# Patient Record
Sex: Male | Born: 1980 | Race: White | Hispanic: No | Marital: Married | Smoking: Former smoker
Health system: Southern US, Community
[De-identification: ages and names within clinical notes are randomized; demographics above are authoritative.]

## PROBLEM LIST (undated history)

## (undated) DIAGNOSIS — E785 Hyperlipidemia, unspecified: Secondary | ICD-10-CM

## (undated) DIAGNOSIS — K429 Umbilical hernia without obstruction or gangrene: Secondary | ICD-10-CM

## (undated) DIAGNOSIS — E559 Vitamin D deficiency, unspecified: Secondary | ICD-10-CM

## (undated) DIAGNOSIS — J189 Pneumonia, unspecified organism: Secondary | ICD-10-CM

## (undated) HISTORY — DX: Vitamin D deficiency, unspecified: E55.9

## (undated) HISTORY — DX: Hyperlipidemia, unspecified: E78.5

## (undated) HISTORY — PX: OTHER SURGICAL HISTORY: SHX169

---

## 2009-06-07 ENCOUNTER — Emergency Department (HOSPITAL_COMMUNITY): Admission: EM | Admit: 2009-06-07 | Discharge: 2009-06-07 | Payer: Self-pay | Admitting: Emergency Medicine

## 2009-06-08 ENCOUNTER — Other Ambulatory Visit: Payer: Self-pay | Admitting: Emergency Medicine

## 2009-06-09 ENCOUNTER — Inpatient Hospital Stay (HOSPITAL_COMMUNITY): Admission: EM | Admit: 2009-06-09 | Discharge: 2009-06-09 | Payer: Self-pay | Admitting: Internal Medicine

## 2010-07-22 IMAGING — CR DG HAND COMPLETE 3+V*R*
3 series · 3 of 3 positions shown · non-contrast
Comparison: None

CLINICAL DATA: History given of edema.  History of snake bite.

RIGHT HAND - COMPLETE 3+ VIEW

[view not recorded (1 of 3)]
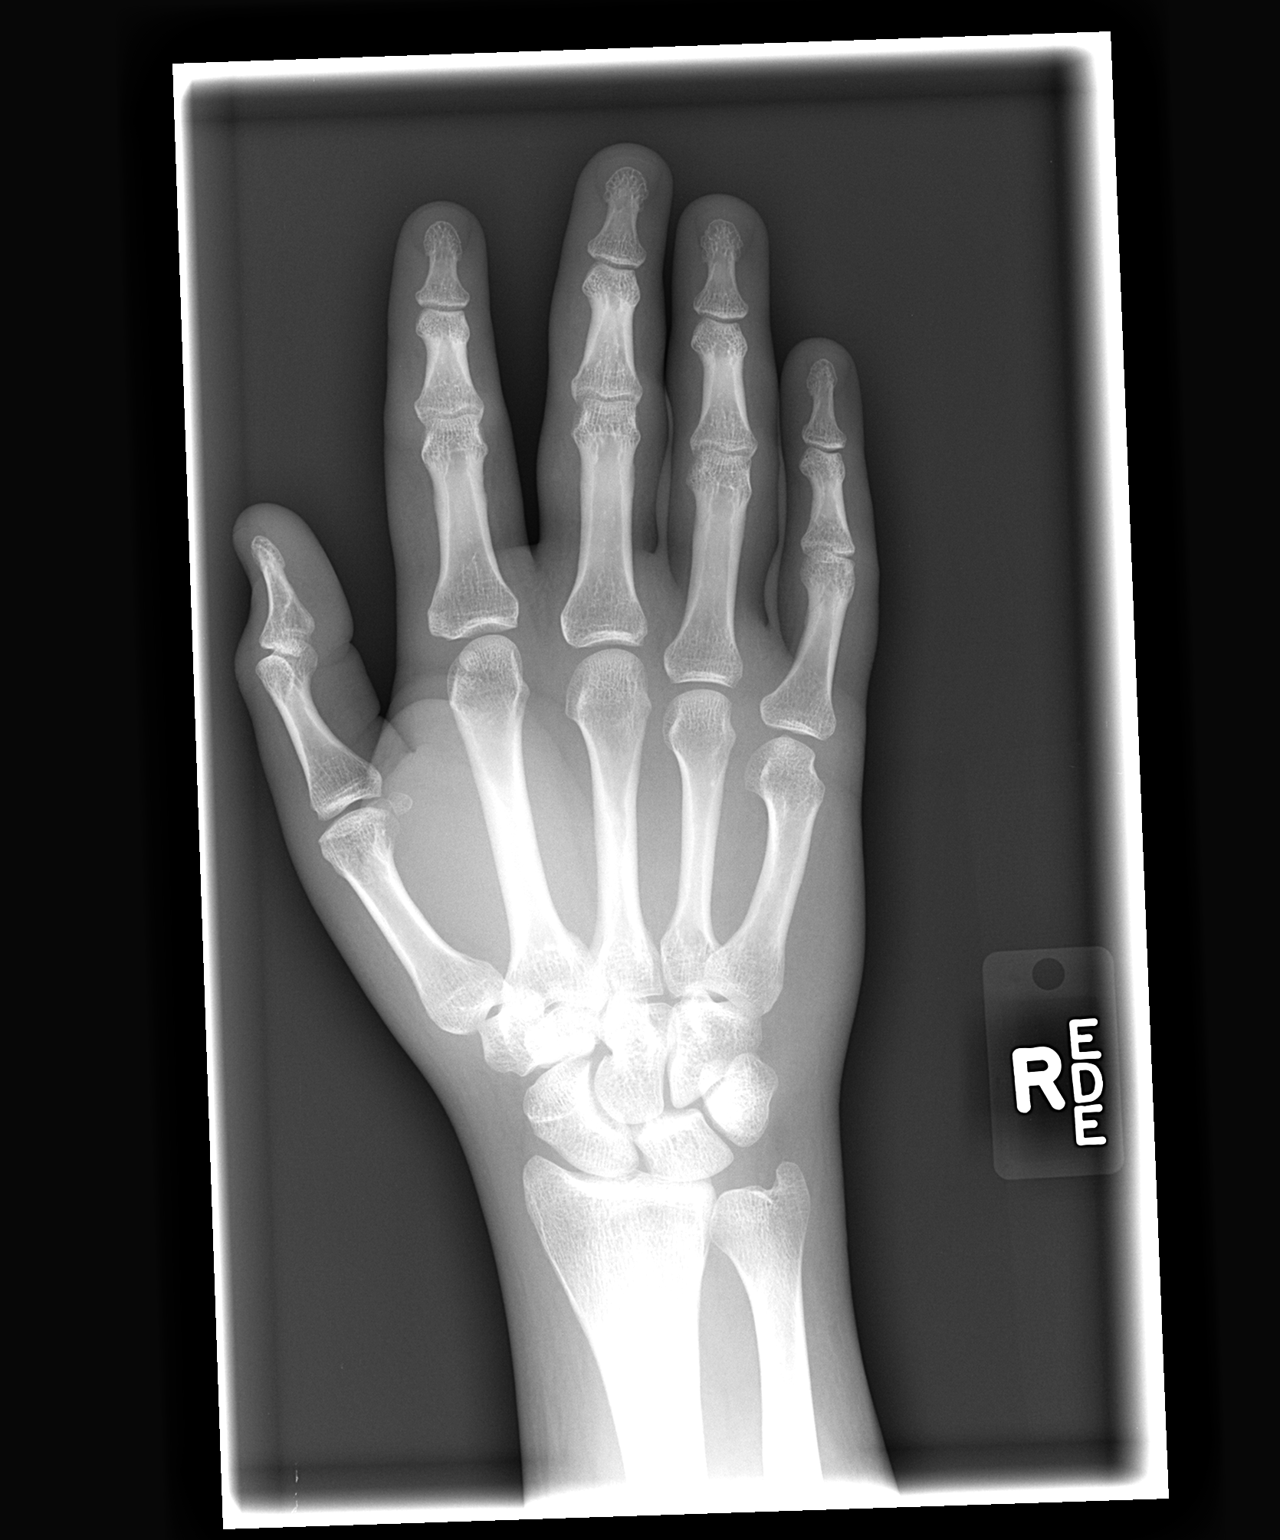

[view not recorded (2 of 3)]
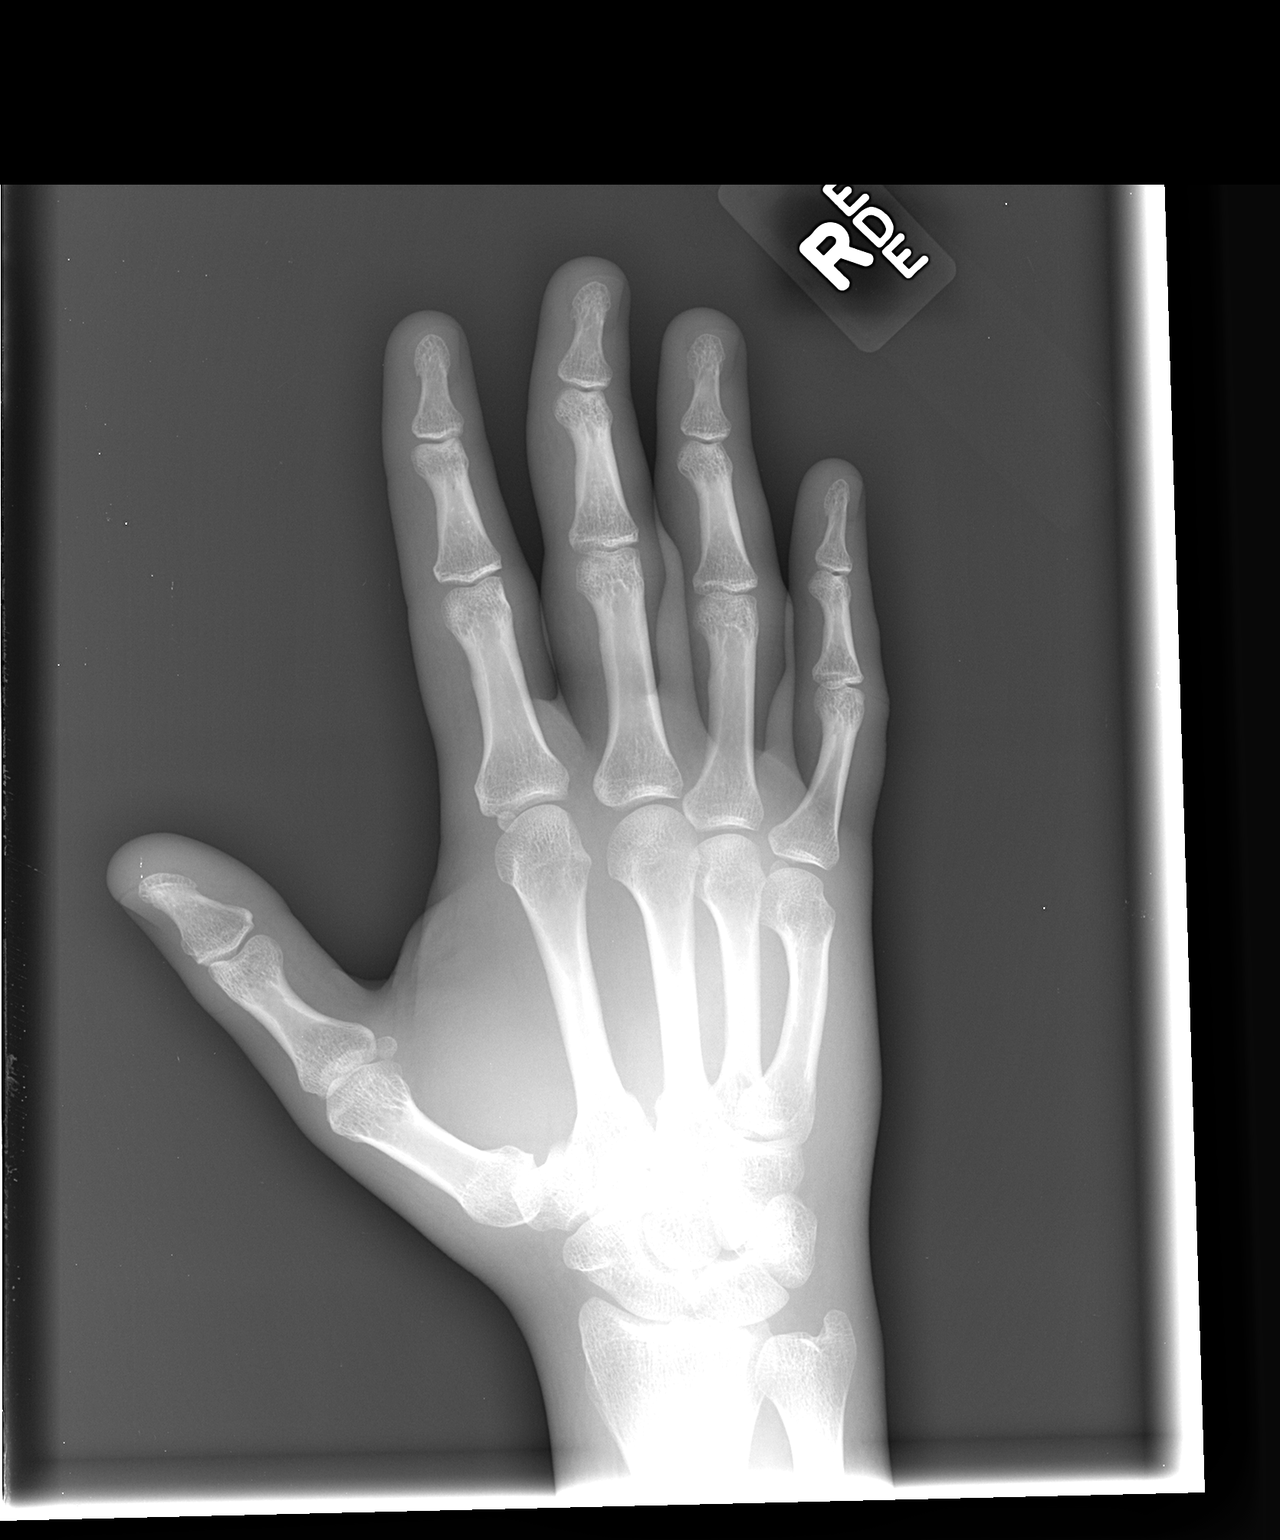

[view not recorded (3 of 3)]
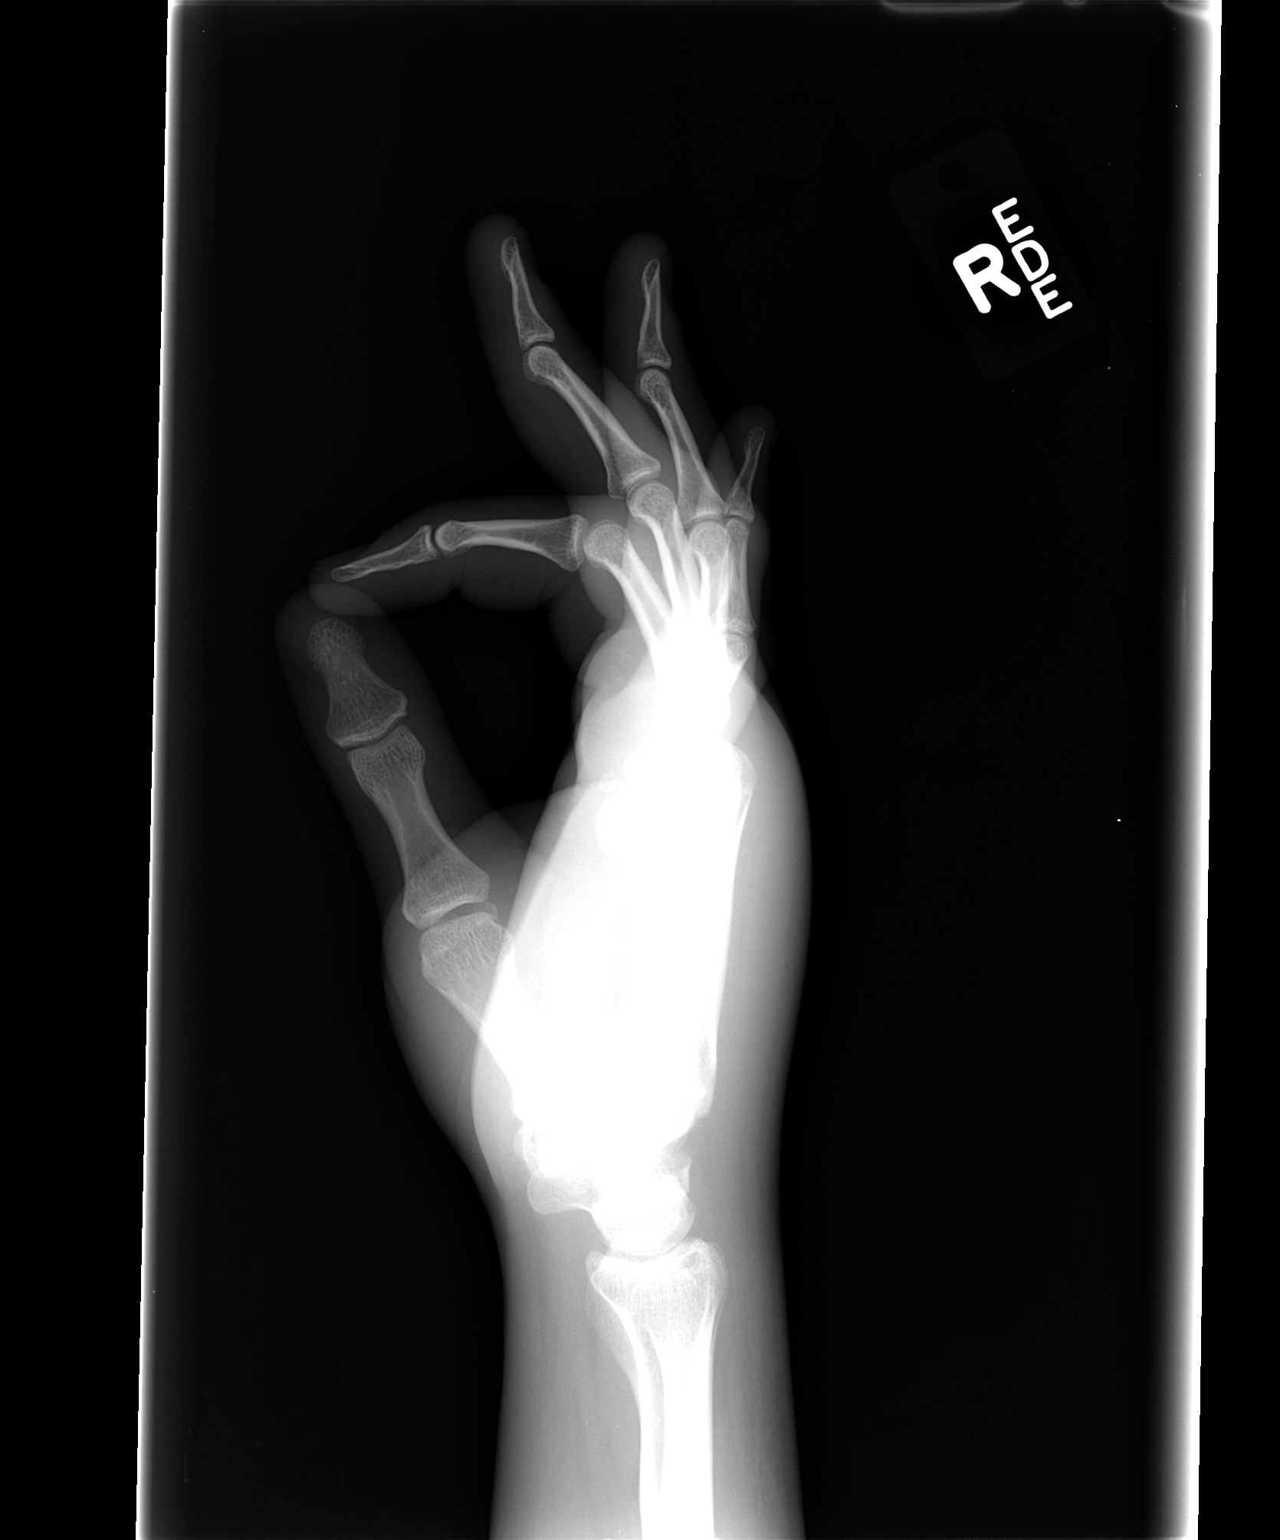

[3 of 3 positions shown; findings below may reference images not displayed]

FINDINGS: There appears to be some soft tissue swelling.  No
fracture or dislocation is evident.  No opaque foreign body is
seen.
IMPRESSION: Soft tissue swelling is present.  No bony lesion is evident.

## 2010-07-22 IMAGING — CR DG WRIST COMPLETE 3+V*R*
2 series · 2 of 2 positions shown · non-contrast
Comparison: 06/09/2009

CLINICAL DATA: History given of snake bite.  History of edema with
swelling of hand and wrist.

RIGHT WRIST - COMPLETE 3+ VIEW

[view not recorded (1 of 2)]
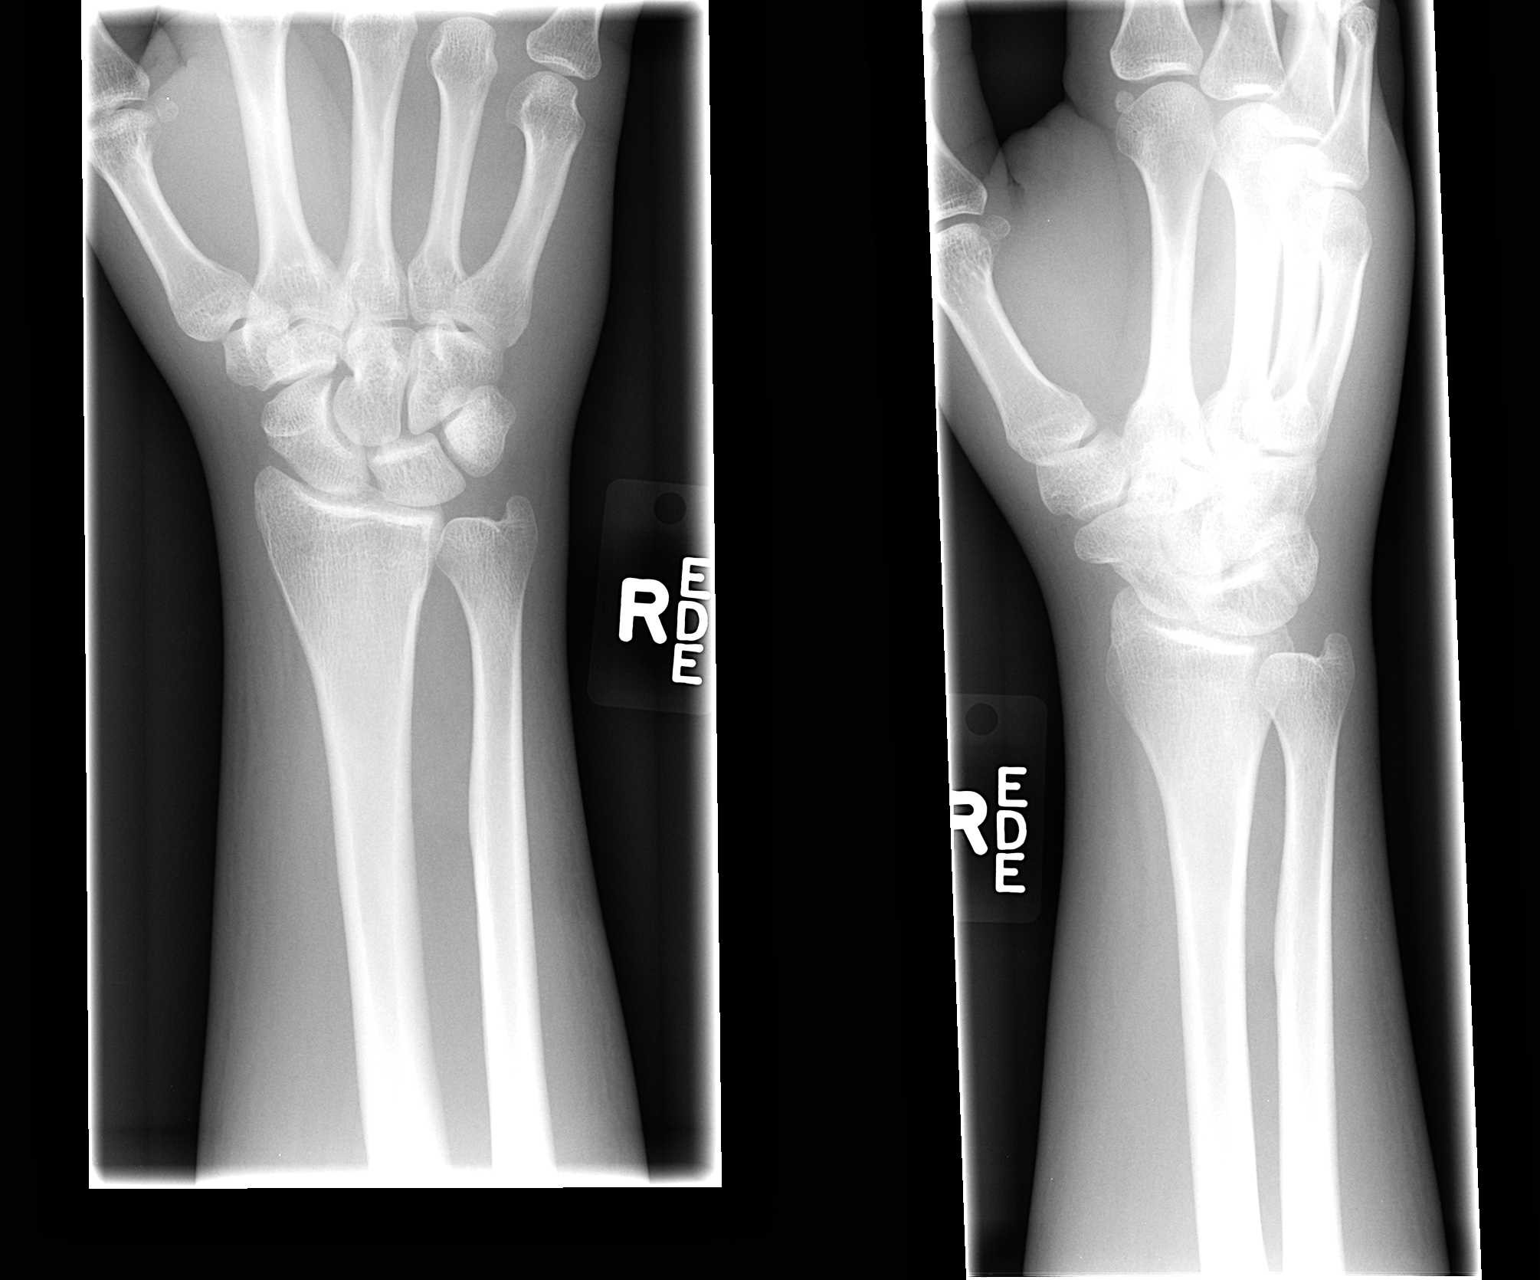

[view not recorded (2 of 2)]
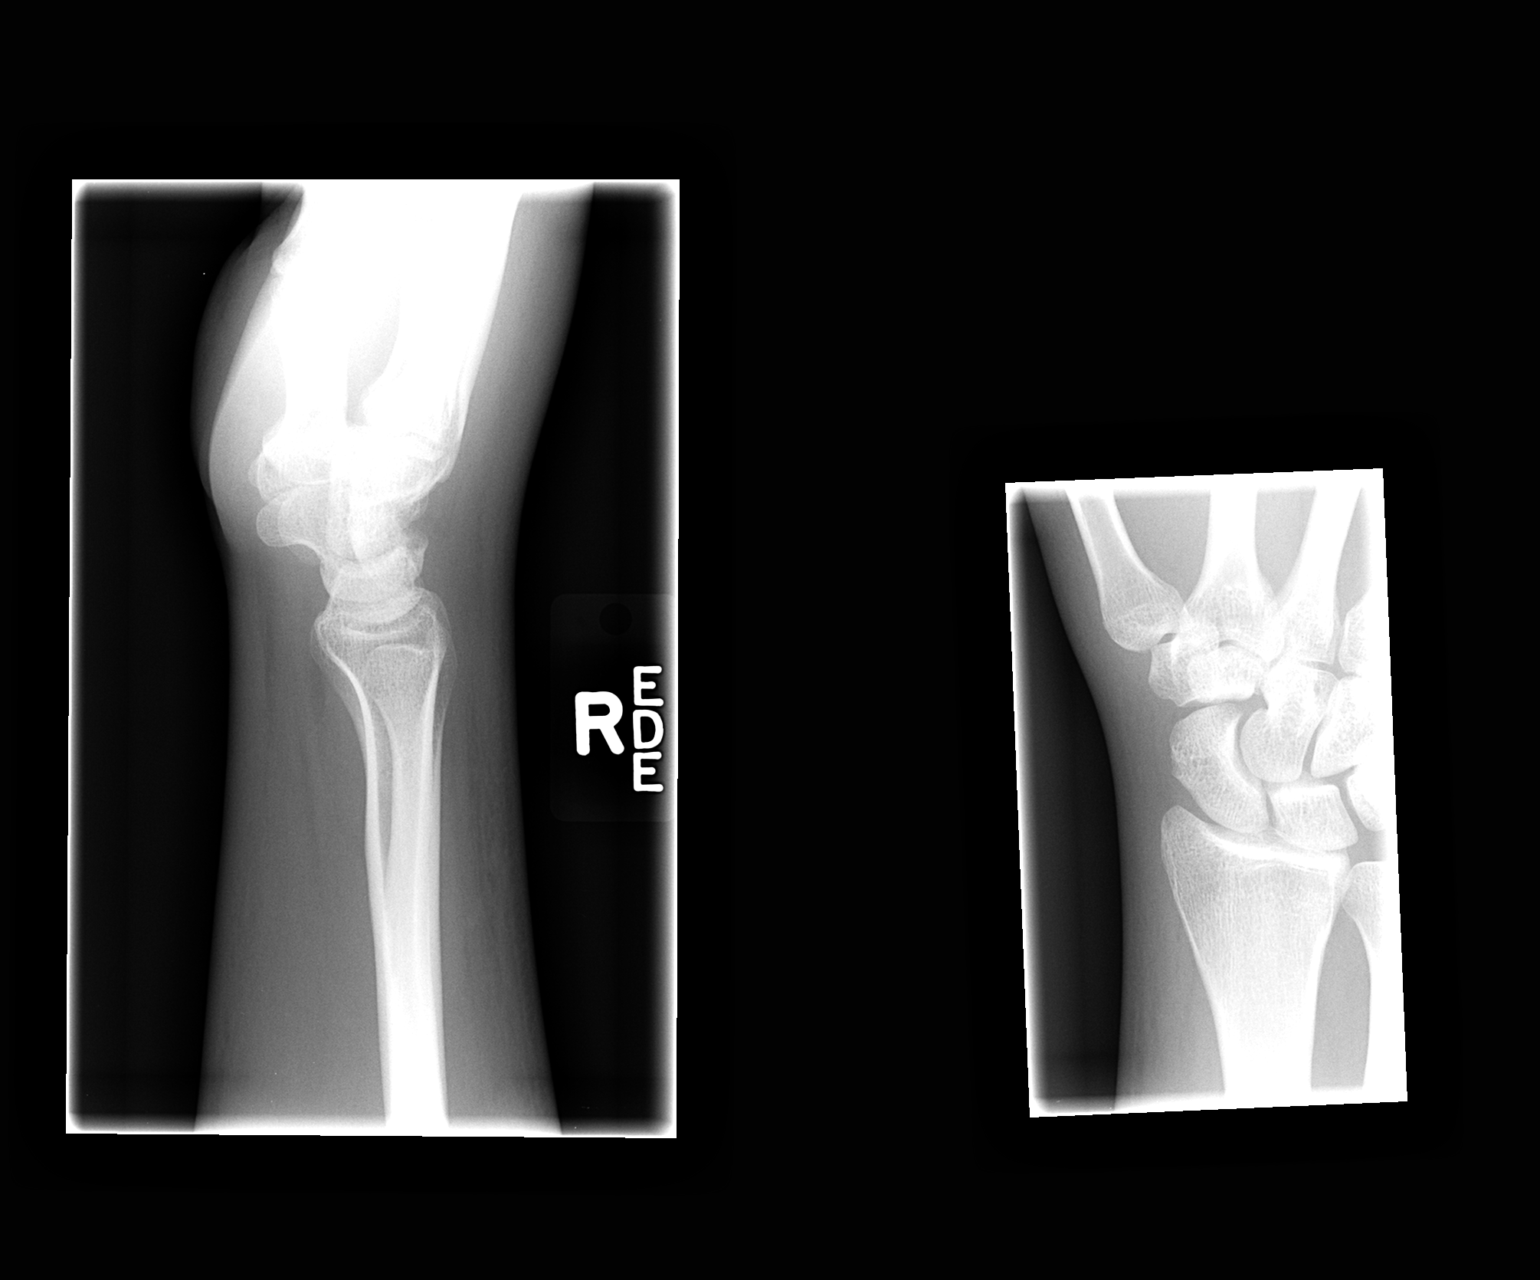

[2 of 2 positions shown; findings below may reference images not displayed]

FINDINGS: Alignment is normal.  Joint spaces are preserved.  No
fracture or dislocation is evident.  There is mild soft tissue
swelling. No scaphoid abnormality is seen.
IMPRESSION: There is mild soft tissue swelling.  No bony abnormality is seen.

## 2011-01-20 LAB — CBC
HCT: 39.2 % (ref 39.0–52.0)
HCT: 45.3 % (ref 39.0–52.0)
Hemoglobin: 15.2 g/dL (ref 13.0–17.0)
Hemoglobin: 15.8 g/dL (ref 13.0–17.0)
MCHC: 34.4 g/dL (ref 30.0–36.0)
MCV: 93.1 fL (ref 78.0–100.0)
Platelets: 142 10*3/uL — ABNORMAL LOW (ref 150–400)
RBC: 4.74 MIL/uL (ref 4.22–5.81)
RDW: 12.1 % (ref 11.5–15.5)
RDW: 12 % (ref 11.5–15.5)
WBC: 9.7 10*3/uL (ref 4.0–10.5)

## 2011-01-20 LAB — COMPREHENSIVE METABOLIC PANEL
ALT: 22 U/L (ref 0–53)
AST: 23 U/L (ref 0–37)
Albumin: 3.4 g/dL — ABNORMAL LOW (ref 3.5–5.2)
Alkaline Phosphatase: 49 U/L (ref 39–117)
Alkaline Phosphatase: 57 U/L (ref 39–117)
BUN: 10 mg/dL (ref 6–23)
BUN: 15 mg/dL (ref 6–23)
CO2: 28 mEq/L (ref 19–32)
Calcium: 8.5 mg/dL (ref 8.4–10.5)
Chloride: 103 mEq/L (ref 96–112)
Creatinine, Ser: 0.58 mg/dL (ref 0.4–1.5)
Glucose, Bld: 119 mg/dL — ABNORMAL HIGH (ref 70–99)
Glucose, Bld: 83 mg/dL (ref 70–99)
Potassium: 3.5 mEq/L (ref 3.5–5.1)
Potassium: 3.8 mEq/L (ref 3.5–5.1)
Sodium: 141 mEq/L (ref 135–145)
Total Bilirubin: 1 mg/dL (ref 0.3–1.2)
Total Bilirubin: 1.3 mg/dL — ABNORMAL HIGH (ref 0.3–1.2)
Total Protein: 5.1 g/dL — ABNORMAL LOW (ref 6.0–8.3)
Total Protein: 7.6 g/dL (ref 6.0–8.3)

## 2011-01-20 LAB — DIFFERENTIAL
Basophils Absolute: 0 10*3/uL (ref 0.0–0.1)
Basophils Absolute: 0 10*3/uL (ref 0.0–0.1)
Basophils Relative: 0 % (ref 0–1)
Basophils Relative: 1 % (ref 0–1)
Eosinophils Absolute: 0.2 10*3/uL (ref 0.0–0.7)
Eosinophils Relative: 2 % (ref 0–5)
Lymphocytes Relative: 29 % (ref 12–46)
Lymphs Abs: 1.4 10*3/uL (ref 0.7–4.0)
Monocytes Absolute: 0.4 10*3/uL (ref 0.1–1.0)
Monocytes Relative: 7 % (ref 3–12)
Monocytes Relative: 5 % (ref 3–12)
Neutro Abs: 2.9 10*3/uL (ref 1.7–7.7)
Neutro Abs: 7.4 10*3/uL (ref 1.7–7.7)
Neutrophils Relative %: 66 % (ref 43–77)
Neutrophils Relative %: 77 % (ref 43–77)

## 2011-01-20 LAB — DIC (DISSEMINATED INTRAVASCULAR COAGULATION)PANEL
INR: 1.1 (ref 0.00–1.49)
INR: 1.1 (ref 0.00–1.49)
Platelets: 165 10*3/uL (ref 150–400)
Platelets: 169 10*3/uL (ref 150–400)
Prothrombin Time: 13.8 seconds (ref 11.6–15.2)
Smear Review: NONE SEEN
Smear Review: NONE SEEN
Smear Review: NONE SEEN
aPTT: 28 seconds (ref 24–37)

## 2011-01-20 LAB — CULTURE, BLOOD (ROUTINE X 2)
Culture: NO GROWTH
Culture: NO GROWTH

## 2011-02-27 NOTE — H&P (Signed)
NAME:  Garrett Jimenez, Garrett Jimenez NO.:  [id number]   MEDICAL RECORD NO.:  [id number]          PATIENT TYPE:  EMS   LOCATION:  ED                           FACILITY:  [facility]   PHYSICIAN:  Peggye Pitt, M.D. DATE OF BIRTH:  Feb 20, 1981   DATE OF ADMISSION:  06/08/2009  DATE OF DISCHARGE:                              HISTORY & PHYSICAL   HISTORY OF PRESENT ILLNESS:  This is a 30 year old male status post  reported snake bite to the right hand, specifically fourth finger  yesterday. Per the patient's report, this was unclear if it was actually  a snake or lizard, as he only saw the tail with yellow and green striped  markings. Following this injury he developed increased pain and edema  and this evening came to the emergency room for evaluation. He was seen  by hand surgery consult, Dr. Mina Marble, who recommends elevation, a  sling, IV antibiotics, and observation. He was given anti-venom and a  first dose of IV Vancomycin in the emergency room. He has no primary  care physician following.   ALLERGIES:  PENICILLIN (causes hives.)   PAST MEDICAL HISTORY:  Not significant.   PAST SURGICAL HISTORY:  None reported.   HOME MEDICATIONS:  He takes no home medications routinely.   SOCIAL HISTORY:  He is married with no children. He smokes tobacco  approximately 1/2 pack daily for 12 years. He drinks alcohol, one or two  drinks per weeks. No illicit drugs. No diet restrictions.   FAMILY HISTORY:  Father with history of hypertension. Mother with no  significant history and both parents are alive. He has 1 sibling with no  significant past medical history. He did have an uncle who did have a  prior cardiac valve replacement.   REVIEW OF SYSTEMS:  GENERAL:  Major complaint is pain and swelling to  the right hand following probable snake bite. He has no other medical  complaints. SKIN:  He has pain and swelling int he right hand. No prior  history of skin cancer. HEENT:  No  significant history.  CHEST/RESPIRATORY:  No significant past history other than his tobacco  use. CARDIOVASCULAR:  He does state that he has occasional irregular  heart beats, which has been a long standing problem. GASTROINTESTINAL:  No complaints. No nausea, vomiting, diarrhea, constipation.  GENITOURINARY:  No complaints today. No history of kidney stones. No  history of urinary tract infection. No venereal disease.  MUSCULOSKELETAL:  Major complaint is pain and swelling to the right hand  following a probable snake bit. No history of arthritis. No indication  of fractures. NEUROLOGIC:  No complaint of dizziness, fainting,  headache, or numbness. PSYCHIATRIC:  No history of depression or  anxiety. ENDOCRINE:  No history of diabetes mellitus or thyroid  irregularity. LYMPHATIC:  No history of anemia. No recurrent infections.   PHYSICAL EXAMINATION:  VITAL SIGNS:  Temperature 98.6, pulse 96,  respiratory rate 20, blood pressure 116/77. Ox saturation on room air is  98%.  GENERAL:  A well developed young male in no acute distress.  SKIN:  Diffuse erythema,  edema, bruising second through fifth digits  extending over the entire hand and to the rest as well as the inner  aspect of the distal lower arm laterally. He has a small abrasion-like  wound to the 4th DIP.  HEENT:  PERRLA.  NECK:  Thyroid, no jugular venous distention or masses. Trachea is  midline. No thyroid enlargement.  LYMPHATIC:  No adenopathy, cervical, or axillary, particularly right  axillary nodes.  CHEST/RESPIRATORY:  Clear and equal on room air. No indication of  stress.  CARDIOVASCULAR:  Regular rate and rhythm. No murmur or irregularity  noted. Normal radial pulses, slightly reduced on the right side likely  due to edema.  GASTROINTESTINAL:  Soft. Positive bowel sounds. No scars indicated. No  masses.  GENITOURINARY:  Deferred.  MUSCULOSKELETAL:  Normal examination.  BACK:  No kyphosis or tenderness.   EXTREMITIES:  Left upper extremity full range of motion  with normal  strength 5/5. Right upper extremity full range of motion  with normal  strength 5/5. Right hand wound as above. His grip is strong on the right  hand with pain. Left lower extremity full range of motion with strength  5/5. Right lower extremity full range of motion  with strength 5/5.  NEUROLOGIC:  Cranial nerves 2-12 are grossly intact. The patient is  alert and oriented. There is no tremor. Speech is clear.   LABORATORY DATA:  CBC with white blood cell of 6.6, hemoglobin 15.2,  platelets 168,000.  Comprehensive metabolic panel:  Sodium 141,  potassium 3.8, chloride 105, CO2 28, BUN 11, and creatinine 0.83. Total  bilirubin mildly elevated at 1.4, alkaline phosphatase 57, AST 23 and  ALT 22. BiC panel, platelets 169,000. PT 13.8, INR 1.1, PTT 27,  fibrinogen 256, and D-dimer is 0.30.   MEDICATIONS:  1. Anti-venom given in ER.  2. Vancomycin with pharmacy dosing, first dose given in the ER.  3. Imipenem with pharmacy to follow dosing.  4. Prn Tylenol 650 mg every 4 hours as needed for pain.  5. Vicodin 5/500 mg 1 tablet every 4 hours as needed for pain.  6. Lovenox 40 mg SQ DVT prophylaxis, pharmacy to adjust dosing.  7. Protonix 40 mg 1 tablet daily, GI prophylaxis.   IMPRESSION/PLAN:  1. Right hand cellulitis, status post snake bite. Will follow plan as      laid out by hand surgery, consult Dr. Mina Marble. This includes      elevation of the affected limb, sling when up, antibiotics, and      observation. I would repeat CBC with diff and comprehensive      metabolic panel for a.m. Routine chest x-ray of the right hand and      wrist.  2. Mild elevation, total bilirubin. Will simply monitor at this point      and repeat liver function studies in the a.m. with comprehensive      metabolic panel.  3. Tobacco use.  Facility cessation education.  4. No primary care physician. Social work to evaluate and make       recommendations.   Everett Graff, RN, GNP, reviewed by Dr. Ardyth Harps, M.D.     Peggye Pitt, M.D.  Electronically Signed    EH/MEDQ  D:  06/08/2009  T:  06/08/2009  Job:  [id number]   cc:   Everett Graff, N.P.

## 2011-02-27 NOTE — Discharge Summary (Signed)
NAMEARTHUR, SPEAGLE NO.:  [id number]   MEDICAL RECORD NO.:  [id number]          PATIENT TYPE:  INP   LOCATION:  5127                         FACILITY:  [facility]   PHYSICIAN:  Charlestine Massed, MDDATE OF BIRTH:  [birth year]   DATE OF ADMISSION:  06/09/2009  DATE OF DISCHARGE:  06/09/2009                               DISCHARGE SUMMARY   PRIMARY CARE PHYSICIAN:  Unassigned.   REASON FOR ADMISSION:  Snake bite, right hand, right middle finger.   DISCHARGE DIAGNOSIS:  1. 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  2. Cellulitis secondary to snake bite and no evidence of any      coagulopathy.  3. Noted as allergic to PENICILLIN causing hives.   DISCHARGE MEDICATIONS:  Tylenol # 3 - one to two tablets p.o. q.6 h.  p.r.n. for pain, total of 20 tablets dispensed.   HOSPITAL COURSE:  Snake bite.  Mr. Nilton Lave is a 30 year old  gentleman, who reported to the emergency room at [facility] after he  was bitten in his right middle finger by a snake.  He reportedly saw  only the tail part showing stripes, and he reported to the emergency  room where the ER physician after discussing the issues, they decided  that the patient has had a copperhead snake bite.  The issue was  discussed by the ER physician over the phone with Poison Control and  after the description given, it was decided to give the patient 5 vials  of CroFab.  The patient received a CroFab and has been monitored  overnight and today, and was sent over to [facility] to be  admitted.  His DIC panel yesterday and today have been staying stable  and today the latest DIC panel at 12 o'clock is also stable.  His  cellulitis in the right upper hand was evaluated.  A hand surgeon was  also consulted, and he has suggested the patient keep the hand elevated  and no need for any dressing.  Today, the swelling has come down  considerably.  There is no itching associated and the swelling did not  go  beyond the line of demarcation put on, pain is much better, and he  was able to shake my hand today.  The patient's issue was again  discussed with Poison Control today, and they stated that the patient at  this time will be definitely medically cleared from that point to go  home.  The patient is discharged back home as his DIC panel was stable.  Poison Control to follow up the patient at home.  He is being discharged  home on pain medications, as mentioned above.   DISCHARGE INSTRUCTIONS:  1. Keep the hand elevated and no need for any sling at this time and      keep hand clean.  2. Poison Control will confirm the patient's status at home.   DISPOSITION:  Discharged back home.   FOLLOWUP:  Follow up with primary care physician as needed.   If there is any further increase in swelling, report to the emergency  room ASAP.   Total of 40 minutes spent on the discharge.      Charlestine Massed, MD  Electronically Signed     UT/MEDQ  D:  06/09/2009  T:  06/10/2009  Job:  [id number]

## 2014-03-15 ENCOUNTER — Encounter (INDEPENDENT_AMBULATORY_CARE_PROVIDER_SITE_OTHER): Payer: Self-pay | Admitting: General Surgery

## 2014-03-15 ENCOUNTER — Ambulatory Visit (INDEPENDENT_AMBULATORY_CARE_PROVIDER_SITE_OTHER): Payer: 59 | Admitting: General Surgery

## 2014-03-15 VITALS — BP 122/82 | HR 80 | Temp 97.8°F | Ht 74.0 in | Wt 190.0 lb

## 2014-03-15 DIAGNOSIS — K429 Umbilical hernia without obstruction or gangrene: Secondary | ICD-10-CM

## 2014-03-15 NOTE — Progress Notes (Signed)
Patient ID: Garrett Jimenez, male   DOB: 09/08/1981, 33 y.o.   MRN: [id number]  Chief Complaint  Patient presents with  . Umbilical Hernia    HPI Garrett Jimenez is a 33 y.o. male.  The patient is a 33-year-old male who is referred for evaluation of an umbilical hernia. The patient's been there for at least 2-3 years. He states it is becoming more painful and gotten bigger over the last 2-3 months. 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 Patient had no signs or symptoms of incarceration translation.  HPI  History reviewed. No pertinent past medical history.  Past Surgical History  Procedure Laterality Date  . Wisdome teeth      History reviewed. No pertinent family history.  Social History History  Substance Use Topics  . Smoking status: Current Every Day Smoker    Types: Cigarettes  . Smokeless tobacco: Not on file  . Alcohol Use: Not on file    Allergies  Allergen Reactions  . Amoxicillin     No current outpatient prescriptions on file.   No current facility-administered medications for this visit.    Review of Systems Review of Systems  Constitutional: Negative.   HENT: Negative.   Eyes: Negative.   Respiratory: Negative.   Cardiovascular: Negative.   Gastrointestinal: Negative.   Endocrine: Negative.   Neurological: Negative.     Blood pressure 122/82, pulse 80, temperature 97.8 F (36.6 C), height 6' 2" (1.88 m), weight 190 lb (86.183 kg).  Physical Exam Physical Exam  Constitutional: He is oriented to person, place, and time. He appears well-developed and well-nourished.  HENT:  Head: Normocephalic and atraumatic.  Eyes: Conjunctivae and EOM are normal. Pupils are equal, round, and reactive to light.  Neck: Normal range of motion. Neck supple.  Cardiovascular: Normal rate, regular rhythm and normal heart sounds.   Pulmonary/Chest: Effort normal and breath sounds normal.  Abdominal: Soft. Bowel sounds are normal. He exhibits no  distension. There is no tenderness. There is no rebound and no guarding. A hernia is present. Hernia confirmed positive in the ventral area.    Musculoskeletal: Normal range of motion.  Neurological: He is alert and oriented to person, place, and time.  Skin: Skin is warm and dry.    Data Reviewed none  Assessment    33-year-old male with a primary umbilical hernia     Plan    1. Patient like to proceed to the operating room for a laparoscopic umbilical hernia repair with mesh. 2. All risks and benefits were discussed with the patient, to generally include infection, bleeding, damage to surrounding structures, acute and chronic nerve pain, and recurrence. Alternatives were offered and described.  All questions were answered and the patient voiced understanding of the procedure and wishes to proceed at this point.         Bentleigh Stankus 03/15/2014, 10:09 AM    

## 2014-03-24 ENCOUNTER — Encounter (HOSPITAL_COMMUNITY): Payer: Self-pay | Admitting: Pharmacy Technician

## 2014-03-25 ENCOUNTER — Encounter (HOSPITAL_COMMUNITY): Payer: Self-pay

## 2014-03-25 ENCOUNTER — Encounter (HOSPITAL_COMMUNITY)
Admission: RE | Admit: 2014-03-25 | Discharge: 2014-03-25 | Disposition: A | Discharge status: Home / Self Care | Payer: 59 | Source: Ambulatory Visit | Attending: General Surgery | Admitting: General Surgery

## 2014-03-25 ENCOUNTER — Other Ambulatory Visit (HOSPITAL_COMMUNITY): Payer: Self-pay | Admitting: *Deleted

## 2014-03-25 DIAGNOSIS — Z01818 Encounter for other preprocedural examination: Secondary | ICD-10-CM | POA: Insufficient documentation

## 2014-03-25 DIAGNOSIS — Z01812 Encounter for preprocedural laboratory examination: Secondary | ICD-10-CM | POA: Insufficient documentation

## 2014-03-25 HISTORY — DX: Umbilical hernia without obstruction or gangrene: K42.9

## 2014-03-25 HISTORY — DX: Pneumonia, unspecified organism: J18.9

## 2014-03-25 LAB — CBC
HEMATOCRIT: 43.5 % (ref 39.0–52.0)
Hemoglobin: 15.2 g/dL (ref 13.0–17.0)
MCH: 31.3 pg (ref 26.0–34.0)
MCHC: 34.9 g/dL (ref 30.0–36.0)
MCV: 89.5 fL (ref 78.0–100.0)
Platelets: 185 10*3/uL (ref 150–400)
RBC: 4.86 MIL/uL (ref 4.22–5.81)
RDW: 11.5 % (ref 11.5–15.5)
WBC: 7.7 10*3/uL (ref 4.0–10.5)

## 2014-03-25 NOTE — Pre-Procedure Instructions (Addendum)
Garrett Jimenez  03/25/2014   Your procedure is scheduled on:  Monday, March 29, 2014 at 7:30 AM.    Report to [facility] Entrance "A" Admitting Office at 5:30 AM.   Call this number if you have problems the morning of surgery: [phone]   Remember:   Do not eat food or drink liquids after midnight Sunday, 03/28/14.   Take these medicines the morning of surgery with A SIP OF WATER: None   Do not wear jewelry.  Do not wear lotions, powders, or cologne. You may wear deodorant.  Men may shave face and neck.  Do not bring valuables to the hospital.  [facility] is not responsible                  for any belongings or valuables.               Contacts, dentures or bridgework may not be worn into surgery.  Leave suitcase in the car. After surgery it may be brought to your room.  For patients admitted to the hospital, discharge time is determined by your                treatment team.               Patients discharged the day of surgery will not be allowed to drive home.  Name and phone number of your driver: Family/friend   Special Instructions: [facility] - Preparing for Surgery  Before surgery, you can play an important role.  Because skin is not sterile, your skin needs to be as free of germs as possible.  You can reduce the number of germs on you skin by washing with CHG (chlorahexidine gluconate) soap before surgery.  CHG is an antiseptic cleaner which kills germs and bonds with the skin to continue killing germs even after washing.  Please DO NOT use if you have an allergy to CHG or antibacterial soaps.  If your skin becomes reddened/irritated stop using the CHG and inform your nurse when you arrive at Short Stay.  Do not shave (including legs and underarms) for at least 48 hours prior to the first CHG shower.  You may shave your face.  Please follow these instructions carefully:   1.  Shower with CHG Soap the night before surgery and the                                 morning of Surgery.  2.  If you choose to wash your hair, wash your hair first as usual with your       normal shampoo.  3.  After you shampoo, rinse your hair and body thoroughly to remove the                      Shampoo.  4.  Use CHG as you would any other liquid soap.  You can apply chg directly       to the skin and wash gently with scrungie or a clean washcloth.  5.  Apply the CHG Soap to your body ONLY FROM THE NECK DOWN.        Do not use on open wounds or open sores.  Avoid contact with your eyes, ears, mouth and genitals (private parts).  Wash genitals (private parts) with your normal soap.  6.  Wash thoroughly, paying special attention to the area where  your surgery        will be performed.  7.  Thoroughly rinse your body with warm water from the neck down.  8.  DO NOT shower/wash with your normal soap after using and rinsing off       the CHG Soap.  9.  Pat yourself dry with a clean towel.            10.  Wear clean pajamas.            11.  Place clean sheets on your bed the night of your first shower and do not        sleep with pets.  Day of Surgery  Do not apply any lotions the morning of surgery.  Please wear clean clothes to the hospital/surgery center.     Please read over the following fact sheets that you were given: Pain Booklet, Coughing and Deep Breathing and Surgical Site Infection Prevention

## 2014-03-25 NOTE — Progress Notes (Signed)
Pt does not have a PCP. 

## 2014-03-28 MED ORDER — VANCOMYCIN HCL IN DEXTROSE 1-5 GM/200ML-% IV SOLN
1000.0000 mg | INTRAVENOUS | Status: AC
  Administered 2014-03-29: 1000 mg via INTRAVENOUS
  Filled 2014-03-28: qty 200

## 2014-03-29 ENCOUNTER — Encounter (HOSPITAL_COMMUNITY): Payer: Self-pay | Admitting: Anesthesiology

## 2014-03-29 ENCOUNTER — Ambulatory Visit (HOSPITAL_COMMUNITY)
Admission: RE | Admit: 2014-03-29 | Discharge: 2014-03-29 | Disposition: A | Discharge status: Home / Self Care | Payer: 59 | Source: Ambulatory Visit | Attending: General Surgery | Admitting: General Surgery

## 2014-03-29 ENCOUNTER — Encounter (HOSPITAL_COMMUNITY)
Admission: RE | Disposition: A | Discharge status: Home / Self Care | Payer: Self-pay | Source: Ambulatory Visit | Attending: General Surgery

## 2014-03-29 ENCOUNTER — Encounter (HOSPITAL_COMMUNITY): Payer: 59 | Admitting: Anesthesiology

## 2014-03-29 ENCOUNTER — Ambulatory Visit (HOSPITAL_COMMUNITY): Payer: 59 | Admitting: Anesthesiology

## 2014-03-29 DIAGNOSIS — K429 Umbilical hernia without obstruction or gangrene: Secondary | ICD-10-CM | POA: Insufficient documentation

## 2014-03-29 DIAGNOSIS — F17200 Nicotine dependence, unspecified, uncomplicated: Secondary | ICD-10-CM | POA: Insufficient documentation

## 2014-03-29 DIAGNOSIS — Z880 Allergy status to penicillin: Secondary | ICD-10-CM | POA: Insufficient documentation

## 2014-03-29 HISTORY — PX: UMBILICAL HERNIA REPAIR: SHX196

## 2014-03-29 HISTORY — PX: INSERTION OF MESH: SHX5868

## 2014-03-29 SURGERY — REPAIR, HERNIA, UMBILICAL, LAPAROSCOPIC
Anesthesia: General | Site: Abdomen

## 2014-03-29 MED ORDER — FENTANYL CITRATE 0.05 MG/ML IJ SOLN
INTRAMUSCULAR | Status: AC
  Filled 2014-03-29: qty 5

## 2014-03-29 MED ORDER — LIDOCAINE HCL (CARDIAC) 20 MG/ML IV SOLN
INTRAVENOUS | Status: AC
  Filled 2014-03-29: qty 5

## 2014-03-29 MED ORDER — BUPIVACAINE HCL (PF) 0.25 % IJ SOLN
INTRAMUSCULAR | Status: AC
  Filled 2014-03-29: qty 30

## 2014-03-29 MED ORDER — ONDANSETRON HCL 4 MG/2ML IJ SOLN
INTRAMUSCULAR | Status: DC | PRN
  Administered 2014-03-29: 4 mg via INTRAVENOUS

## 2014-03-29 MED ORDER — FENTANYL CITRATE 0.05 MG/ML IJ SOLN
INTRAMUSCULAR | Status: DC | PRN
  Administered 2014-03-29: 50 ug via INTRAVENOUS
  Administered 2014-03-29 (×2): 100 ug via INTRAVENOUS

## 2014-03-29 MED ORDER — NEOSTIGMINE METHYLSULFATE 10 MG/10ML IV SOLN
INTRAVENOUS | Status: AC
  Filled 2014-03-29: qty 1

## 2014-03-29 MED ORDER — METOCLOPRAMIDE HCL 5 MG/ML IJ SOLN: 10.0000 mg | Freq: Once | INTRAMUSCULAR | Status: DC | PRN

## 2014-03-29 MED ORDER — OXYCODONE HCL 5 MG PO TABS
ORAL_TABLET | ORAL | Status: AC
  Filled 2014-03-29: qty 1

## 2014-03-29 MED ORDER — ROCURONIUM BROMIDE 50 MG/5ML IV SOLN
INTRAVENOUS | Status: AC
  Filled 2014-03-29: qty 1

## 2014-03-29 MED ORDER — NEOSTIGMINE METHYLSULFATE 10 MG/10ML IV SOLN
INTRAVENOUS | Status: DC | PRN
  Administered 2014-03-29: 3 mg via INTRAVENOUS

## 2014-03-29 MED ORDER — LIDOCAINE HCL (CARDIAC) 20 MG/ML IV SOLN
INTRAVENOUS | Status: DC | PRN
  Administered 2014-03-29: 100 mg via INTRAVENOUS

## 2014-03-29 MED ORDER — ROCURONIUM BROMIDE 100 MG/10ML IV SOLN
INTRAVENOUS | Status: DC | PRN
  Administered 2014-03-29: 40 mg via INTRAVENOUS

## 2014-03-29 MED ORDER — DEXAMETHASONE SODIUM PHOSPHATE 4 MG/ML IJ SOLN
INTRAMUSCULAR | Status: AC
  Filled 2014-03-29: qty 1

## 2014-03-29 MED ORDER — CHLORHEXIDINE GLUCONATE 4 % EX LIQD
1.0000 "application " | Freq: Once | CUTANEOUS | Status: DC
  Filled 2014-03-29: qty 15

## 2014-03-29 MED ORDER — HYDROMORPHONE HCL PF 1 MG/ML IJ SOLN
INTRAMUSCULAR | Status: AC
  Filled 2014-03-29: qty 1

## 2014-03-29 MED ORDER — PROPOFOL 10 MG/ML IV BOLUS
INTRAVENOUS | Status: DC | PRN
  Administered 2014-03-29: 200 mg via INTRAVENOUS

## 2014-03-29 MED ORDER — PROPOFOL 10 MG/ML IV BOLUS
INTRAVENOUS | Status: AC
  Filled 2014-03-29: qty 20

## 2014-03-29 MED ORDER — SODIUM CHLORIDE 0.9 % IJ SOLN
INTRAMUSCULAR | Status: AC
  Filled 2014-03-29: qty 10

## 2014-03-29 MED ORDER — 0.9 % SODIUM CHLORIDE (POUR BTL) OPTIME
TOPICAL | Status: DC | PRN
  Administered 2014-03-29: 1000 mL

## 2014-03-29 MED ORDER — LACTATED RINGERS IV SOLN
INTRAVENOUS | Status: DC | PRN
  Administered 2014-03-29: 07:00:00 via INTRAVENOUS

## 2014-03-29 MED ORDER — HYDROMORPHONE HCL PF 1 MG/ML IJ SOLN
0.2500 mg | INTRAMUSCULAR | Status: DC | PRN
  Administered 2014-03-29 (×2): 0.5 mg via INTRAVENOUS

## 2014-03-29 MED ORDER — SUCCINYLCHOLINE CHLORIDE 20 MG/ML IJ SOLN
INTRAMUSCULAR | Status: AC
  Filled 2014-03-29: qty 1

## 2014-03-29 MED ORDER — ONDANSETRON HCL 4 MG/2ML IJ SOLN
INTRAMUSCULAR | Status: AC
  Filled 2014-03-29: qty 2

## 2014-03-29 MED ORDER — BUPIVACAINE HCL 0.25 % IJ SOLN
INTRAMUSCULAR | Status: DC | PRN
  Administered 2014-03-29: 12 mL

## 2014-03-29 MED ORDER — METOCLOPRAMIDE HCL 5 MG/ML IJ SOLN
INTRAMUSCULAR | Status: DC | PRN
  Administered 2014-03-29: 10 mg via INTRAVENOUS

## 2014-03-29 MED ORDER — OXYCODONE-ACETAMINOPHEN 10-325 MG PO TABS: 1.0000 | ORAL_TABLET | ORAL | Status: DC | PRN

## 2014-03-29 MED ORDER — METOCLOPRAMIDE HCL 5 MG/ML IJ SOLN
INTRAMUSCULAR | Status: AC
  Filled 2014-03-29: qty 2

## 2014-03-29 MED ORDER — OXYCODONE HCL 5 MG/5ML PO SOLN: 5.0000 mg | Freq: Once | ORAL | Status: AC | PRN

## 2014-03-29 MED ORDER — MIDAZOLAM HCL 2 MG/2ML IJ SOLN
INTRAMUSCULAR | Status: AC
  Filled 2014-03-29: qty 2

## 2014-03-29 MED ORDER — GLYCOPYRROLATE 0.2 MG/ML IJ SOLN
INTRAMUSCULAR | Status: DC | PRN
  Administered 2014-03-29: 0.4 mg via INTRAVENOUS

## 2014-03-29 MED ORDER — OXYCODONE HCL 5 MG PO TABS
5.0000 mg | ORAL_TABLET | Freq: Once | ORAL | Status: AC | PRN
  Administered 2014-03-29: 5 mg via ORAL

## 2014-03-29 MED ORDER — DEXAMETHASONE SODIUM PHOSPHATE 4 MG/ML IJ SOLN
INTRAMUSCULAR | Status: DC | PRN
  Administered 2014-03-29: 8 mg via INTRAVENOUS

## 2014-03-29 MED ORDER — MIDAZOLAM HCL 5 MG/5ML IJ SOLN
INTRAMUSCULAR | Status: DC | PRN
  Administered 2014-03-29: 2 mg via INTRAVENOUS

## 2014-03-29 MED ORDER — GLYCOPYRROLATE 0.2 MG/ML IJ SOLN
INTRAMUSCULAR | Status: AC
  Filled 2014-03-29: qty 2

## 2014-03-29 MED ORDER — EPHEDRINE SULFATE 50 MG/ML IJ SOLN
INTRAMUSCULAR | Status: AC
  Filled 2014-03-29: qty 1

## 2014-03-29 SURGICAL SUPPLY — 49 items
APPLIER CLIP LOGIC TI 5 (MISCELLANEOUS) IMPLANT
APPLIER CLIP ROT 10 11.4 M/L (STAPLE)
BENZOIN TINCTURE PRP APPL 2/3 (GAUZE/BANDAGES/DRESSINGS) ×3 IMPLANT
BLADE SURG ROTATE 9660 (MISCELLANEOUS) ×3 IMPLANT
CANISTER SUCTION 2500CC (MISCELLANEOUS) IMPLANT
CHLORAPREP W/TINT 26ML (MISCELLANEOUS) ×3 IMPLANT
CLIP APPLIE ROT 10 11.4 M/L (STAPLE) IMPLANT
COVER SURGICAL LIGHT HANDLE (MISCELLANEOUS) ×3 IMPLANT
DEVICE SECURE STRAP 25 ABSORB (INSTRUMENTS) ×3 IMPLANT
DEVICE TROCAR PUNCTURE CLOSURE (ENDOMECHANICALS) ×3 IMPLANT
DRAPE UTILITY 15X26 W/TAPE STR (DRAPE) ×6 IMPLANT
ELECT REM PT RETURN 9FT ADLT (ELECTROSURGICAL) ×3
ELECTRODE REM PT RTRN 9FT ADLT (ELECTROSURGICAL) ×1 IMPLANT
GAUZE SPONGE 2X2 8PLY STRL LF (GAUZE/BANDAGES/DRESSINGS) ×1 IMPLANT
GLOVE BIO SURGEON STRL SZ7.5 (GLOVE) ×6 IMPLANT
GLOVE BIOGEL PI IND STRL 6.5 (GLOVE) ×1 IMPLANT
GLOVE BIOGEL PI IND STRL 7.5 (GLOVE) ×2 IMPLANT
GLOVE BIOGEL PI INDICATOR 6.5 (GLOVE) ×2
GLOVE BIOGEL PI INDICATOR 7.5 (GLOVE) ×4
GLOVE ECLIPSE 7.5 STRL STRAW (GLOVE) ×3 IMPLANT
GOWN STRL REUS W/ TWL LRG LVL3 (GOWN DISPOSABLE) ×2 IMPLANT
GOWN STRL REUS W/ TWL XL LVL3 (GOWN DISPOSABLE) ×1 IMPLANT
GOWN STRL REUS W/TWL LRG LVL3 (GOWN DISPOSABLE) ×4
GOWN STRL REUS W/TWL XL LVL3 (GOWN DISPOSABLE) ×2
KIT BASIN OR (CUSTOM PROCEDURE TRAY) ×3 IMPLANT
KIT ROOM TURNOVER OR (KITS) ×3 IMPLANT
MARKER SKIN DUAL TIP RULER LAB (MISCELLANEOUS) ×3 IMPLANT
MESH VENTRALIGHT ST 4.5IN (Mesh General) ×3 IMPLANT
NEEDLE INSUFFLATION 14GA 120MM (NEEDLE) ×3 IMPLANT
NEEDLE SPNL 22GX3.5 QUINCKE BK (NEEDLE) IMPLANT
NS IRRIG 1000ML POUR BTL (IV SOLUTION) ×3 IMPLANT
PAD ARMBOARD 7.5X6 YLW CONV (MISCELLANEOUS) ×6 IMPLANT
SCISSORS LAP 5X35 DISP (ENDOMECHANICALS) ×3 IMPLANT
SET IRRIG TUBING LAPAROSCOPIC (IRRIGATION / IRRIGATOR) IMPLANT
SLEEVE ENDOPATH XCEL 5M (ENDOMECHANICALS) ×6 IMPLANT
SPONGE GAUZE 2X2 STER 10/PKG (GAUZE/BANDAGES/DRESSINGS) ×2
SPONGE GAUZE 4X4 12PLY (GAUZE/BANDAGES/DRESSINGS) ×3 IMPLANT
SUT CHROMIC 2 0 SH (SUTURE) ×3 IMPLANT
SUT MNCRL AB 4-0 PS2 18 (SUTURE) ×3 IMPLANT
SUT PROLENE 2 0 KS (SUTURE) IMPLANT
TAPE CLOTH SURG 4X10 WHT LF (GAUZE/BANDAGES/DRESSINGS) ×3 IMPLANT
TOWEL OR 17X24 6PK STRL BLUE (TOWEL DISPOSABLE) IMPLANT
TOWEL OR 17X26 10 PK STRL BLUE (TOWEL DISPOSABLE) ×3 IMPLANT
TOWEL OR NON WOVEN STRL DISP B (DISPOSABLE) ×3 IMPLANT
TRAY FOLEY CATH 14FR (SET/KITS/TRAYS/PACK) IMPLANT
TRAY LAPAROSCOPIC (CUSTOM PROCEDURE TRAY) ×3 IMPLANT
TROCAR XCEL BLUNT TIP 100MML (ENDOMECHANICALS) IMPLANT
TROCAR XCEL NON-BLD 11X100MML (ENDOMECHANICALS) IMPLANT
TROCAR XCEL NON-BLD 5MMX100MML (ENDOMECHANICALS) ×3 IMPLANT

## 2014-03-29 NOTE — Anesthesia Postprocedure Evaluation (Signed)
Anesthesia Post Note  Patient: Garrett Jimenez  Procedure(s) Performed: Procedure(s) (LRB): LAPAROSCOPIC UMBILICAL HERNIA (N/A) INSERTION OF MESH (N/A)  Anesthesia type: General  Patient location: PACU  Post pain: Pain level controlled  Post assessment: Patient's Cardiovascular Status Stable  Last Vitals:  Filed Vitals:   03/29/14 0845  BP:   Pulse: 71  Temp:   Resp: 11    Post vital signs: Reviewed and stable  Level of consciousness: alert  Complications: No apparent anesthesia complications

## 2014-03-29 NOTE — Interval H&P Note (Signed)
History and Physical Interval Note:  03/29/2014 6:59 AM  Garrett Jimenez  has presented today for surgery, with the diagnosis of umbilical hernia  The various methods of treatment have been discussed with the patient and family. After consideration of risks, benefits and other options for treatment, the patient has consented to  Procedure(s): LAPAROSCOPIC UMBILICAL HERNIA (N/A) INSERTION OF MESH (N/A) as a surgical intervention .  The patient's history has been reviewed, patient examined, no change in status, stable for surgery.  I have reviewed the patient's chart and labs.  Questions were answered to the patient's satisfaction.     Marigene Ehlersamirez Jr., Jed LimerickArmando

## 2014-03-29 NOTE — Transfer of Care (Signed)
Immediate Anesthesia Transfer of Care Note  Patient: Garrett Jimenez  Procedure(s) Performed: Procedure(s): LAPAROSCOPIC UMBILICAL HERNIA (N/A) INSERTION OF MESH (N/A)  Patient Location: PACU  Anesthesia Type:General  Level of Consciousness: awake, alert  and oriented  Airway & Oxygen Therapy: Patient Spontanous Breathing and Patient connected to face mask oxygen  Post-op Assessment: Report given to PACU RN, Post -op Vital signs reviewed and stable and Patient moving all extremities X 4  Post vital signs: Reviewed and stable  Complications: No apparent anesthesia complications

## 2014-03-29 NOTE — Op Note (Signed)
03/29/2014  8:03 AM  PATIENT:  Garrett Jimenez  33 y.o. male  PRE-OPERATIVE DIAGNOSIS:  umbilical hernia  POST-OPERATIVE DIAGNOSIS:  umbilical hernia  PROCEDURE:  Procedure(s): LAPAROSCOPIC UMBILICAL HERNIA (N/A) INSERTION OF MESH (N/A)  SURGEON:  Surgeon(s) and Role:    * Axel FillerArmando Lourdes Kucharski, MD - Primary   ASSISTANTS: none   ANESTHESIA:   GETA/ local  EBL:  5cc  BLOOD ADMINISTERED:none  DRAINS: none   LOCAL MEDICATIONS USED:  BUPIVICAINE   SPECIMEN:  No Specimen  DISPOSITION OF SPECIMEN:  N/A  COUNTS:  YES  TOURNIQUET:  * No tourniquets in log *  DICTATION: .Dragon Dictation Details of the procedure:   After the patient was consented patient was taken back to the operating room patient was then placed in supine position bilateral SCDs in place. The patient was prepped and draped in the usual sterile fashion.   After antibiotics were confirmed a timeout was called and all facts were verified. The Veress needle technique was used to insuflate the abdomen at Palmer's point. The abdomen was insufflated to 14 mm mercury. Subsequently a 5 mm trocar was placed a camera inserted there was no injury to any intra-abdominal organs.  There was seen to be an umbilical  Hernia with incarcerated preperitoneal fat.  A second camera port was in placed into the left lower quadrant.   At this the Falicform ligament was taken down  with Bovie cautery maintaining hemostasis. A 5mm port was placed in the epigastrium . I proceeded to reduce the hernia contents which consisted of fat.  Once the hernia was cleared away, a Bard Ventralight 11.4cm  mesh was inserted into the abdomen.  The mesh was secured circumferentially with am Securestrap tacker in a double crown fashion.  The omentum was brought over the area of the mesh. The pneumoperitoneum was evacuated  & all trocars  were removed. The skin was reapproximated with 4-0  Monocryl sutures in a subcuticular fashion. The skin was dressed with Steri-Strips  tape and gauze.  The patient was taken to the recovery room in stable condition.    PLAN OF CARE: Discharge to home after PACU  PATIENT DISPOSITION:  PACU - hemodynamically stable.   Delay start of Pharmacological VTE agent (>24hrs) due to surgical blood loss or risk of bleeding: not applicable

## 2014-03-29 NOTE — Anesthesia Preprocedure Evaluation (Addendum)
Anesthesia Evaluation  Patient identified by MRN, date of birth, ID band Patient awake    Reviewed: Allergy & Precautions, H&P , NPO status , Patient's Chart, lab work & pertinent test results, reviewed documented beta blocker date and time   Airway Mallampati: II TM Distance: >3 FB Neck ROM: full    Dental  (+) Chipped,    Pulmonary pneumonia -, resolved, Current Smoker,  breath sounds clear to auscultation        Cardiovascular negative cardio ROS  Rhythm:regular     Neuro/Psych negative neurological ROS  negative psych ROS   GI/Hepatic negative GI ROS, Neg liver ROS,   Endo/Other  negative endocrine ROS  Renal/GU negative Renal ROS  negative genitourinary   Musculoskeletal   Abdominal   Peds  Hematology negative hematology ROS (+)   Anesthesia Other Findings See surgeon's H&P   Reproductive/Obstetrics negative OB ROS                          Anesthesia Physical Anesthesia Plan  ASA: II  Anesthesia Plan: General   Post-op Pain Management:    Induction: Intravenous  Airway Management Planned: Oral ETT  Additional Equipment:   Intra-op Plan:   Post-operative Plan: Extubation in OR  Informed Consent: I have reviewed the patients History and Physical, chart, labs and discussed the procedure including the risks, benefits and alternatives for the proposed anesthesia with the patient or authorized representative who has indicated his/her understanding and acceptance.   Dental Advisory Given and Dental advisory given  Plan Discussed with: CRNA and Surgeon  Anesthesia Plan Comments:        Anesthesia Quick Evaluation

## 2014-03-29 NOTE — H&P (View-Only) (Signed)
Patient ID: Garrett Jimenez, male   DOB: 03-17-1981, 33 y.o.   MRN: [id number]  Chief Complaint  Patient presents with  . Umbilical Hernia    HPI Garrett Jimenez is a 33 y.o. male.  The patient is a 33 year old male who is referred for evaluation of an umbilical hernia. The patient's been there for at least 2-3 years. He states it is becoming more painful and gotten bigger over the last 2-3 months. 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 Patient had no signs or symptoms of incarceration translation.  HPI  History reviewed. No pertinent past medical history.  Past Surgical History  Procedure Laterality Date  . Wisdome teeth      History reviewed. No pertinent family history.  Social History History  Substance Use Topics  . Smoking status: Current Every Day Smoker    Types: Cigarettes  . Smokeless tobacco: Not on file  . Alcohol Use: Not on file    Allergies  Allergen Reactions  . Amoxicillin     No current outpatient prescriptions on file.   No current facility-administered medications for this visit.    Review of Systems Review of Systems  Constitutional: Negative.   HENT: Negative.   Eyes: Negative.   Respiratory: Negative.   Cardiovascular: Negative.   Gastrointestinal: Negative.   Endocrine: Negative.   Neurological: Negative.     Blood pressure 122/82, pulse 80, temperature 97.8 F (36.6 C), height 6\' 2"  (1.88 m), weight 190 lb (86.183 kg).  Physical Exam Physical Exam  Constitutional: He is oriented to person, place, and time. He appears well-developed and well-nourished.  HENT:  Head: Normocephalic and atraumatic.  Eyes: Conjunctivae and EOM are normal. Pupils are equal, round, and reactive to light.  Neck: Normal range of motion. Neck supple.  Cardiovascular: Normal rate, regular rhythm and normal heart sounds.   Pulmonary/Chest: Effort normal and breath sounds normal.  Abdominal: Soft. Bowel sounds are normal. He exhibits no  distension. There is no tenderness. There is no rebound and no guarding. A hernia is present. Hernia confirmed positive in the ventral area.    Musculoskeletal: Normal range of motion.  Neurological: He is alert and oriented to person, place, and time.  Skin: Skin is warm and dry.    Data Reviewed none  Assessment    33 year old male with a primary umbilical hernia     Plan    1. Patient like to proceed to the operating room for a laparoscopic umbilical hernia repair with mesh. 2. All risks and benefits were discussed with the patient, to generally include infection, bleeding, damage to surrounding structures, acute and chronic nerve pain, and recurrence. Alternatives were offered and described.  All questions were answered and the patient voiced understanding of the procedure and wishes to proceed at this point.         Axel Fillerrmando Denese Mentink 03/15/2014, 10:09 AM

## 2014-03-29 NOTE — Anesthesia Procedure Notes (Signed)
Procedure Name: Intubation Date/Time: 03/29/2014 7:25 AM Performed by: Sharlene DoryWALKER, [name] Pre-anesthesia Checklist: Patient identified, Emergency Drugs available, Suction available, Patient being monitored and Timeout performed Patient Re-evaluated:Patient Re-evaluated prior to inductionOxygen Delivery Method: Circle system utilized Preoxygenation: Pre-oxygenation with 100% oxygen Intubation Type: IV induction Ventilation: Mask ventilation without difficulty Laryngoscope Size: Mac and 3 Grade View: Grade I Tube size: 7.5 mm Number of attempts: 1 Airway Equipment and Method: Stylet Placement Confirmation: ETT inserted through vocal cords under direct vision,  positive ETCO2 and breath sounds checked- equal and bilateral Secured at: 22 cm Tube secured with: Tape Dental Injury: Teeth and Oropharynx as per pre-operative assessment

## 2014-03-29 NOTE — Discharge Instructions (Signed)
[facility] _______[facility] ° °UMBILICAL  HERNIA REPAIR: POST OP INSTRUCTIONS ° °Always review your discharge instruction sheet given to you by the facility where your surgery was performed. °IF YOU HAVE DISABILITY OR FAMILY LEAVE FORMS, YOU MUST BRING THEM TO THE OFFICE FOR PROCESSING.   °DO NOT GIVE THEM TO YOUR DOCTOR. ° °1. A  prescription for pain medication may be given to you upon discharge.  Take your pain medication as prescribed, if needed.  If narcotic pain medicine is not needed, then you may take acetaminophen (Tylenol) or ibuprofen (Advil) as needed. °2. Take your usually prescribed medications unless otherwise directed. °3. If you need a refill on your pain medication, please contact your pharmacy.  They will contact our office to request authorization. Prescriptions will not be filled after 5 pm or on week-ends. °4. You should follow a light diet the first 24 hours after arrival home, such as soup and crackers, etc.  Be sure to include lots of fluids daily.  Resume your normal diet the day after surgery. °5. Most patients will experience some swelling and bruising around the umbilicus or in the groin and scrotum.  Ice packs and reclining will help.  Swelling and bruising can take several days to resolve.  °6. It is common to experience some constipation if taking pain medication after surgery.  Increasing fluid intake and taking a stool softener (such as Colace) will usually help or prevent this problem from occurring.  A mild laxative (Milk of Magnesia or Miralax) should be taken according to package directions if there are no bowel movements after 48 hours. °7. Unless discharge instructions indicate otherwise, you may remove your bandages 24-48 hours after surgery, and you may shower at that time.  You may have steri-strips (small skin tapes) in place directly over the incision.  These strips should be left on the skin for 7-10 days.  If your surgeon used skin glue on the incision, you  may shower in 24 hours.  The glue will flake off over the next 2-3 weeks.  Any sutures or staples will be removed at the office during your follow-up visit. °8. ACTIVITIES:  You may resume regular (light) daily activities beginning the next day--such as daily self-care, walking, climbing stairs--gradually increasing activities as tolerated.  You may have sexual intercourse when it is comfortable.  Refrain from any heavy lifting or straining until approved by your doctor. °a. You may drive when you are no longer taking prescription pain medication, you can comfortably wear a seatbelt, and you can safely maneuver your car and apply brakes. °b. RETURN TO WORK:  __________________________________________________________ °9. You should see your doctor in the office for a follow-up appointment approximately 2-3 weeks after your surgery.  Make sure that you call for this appointment within a day or two after you arrive home to insure a convenient appointment time. °10. OTHER INSTRUCTIONS:  __________________________________________________________________________________________________________________________________________________________________________________________  °WHEN TO CALL YOUR DOCTOR: °1. Fever over 101.0 °2. Inability to urinate °3. Nausea and/or vomiting °4. Extreme swelling or bruising °5. Continued bleeding from incision. °6. Increased pain, redness, or drainage from the incision ° °The clinic staff is available to answer your questions during regular business hours.  Please don’t hesitate to call and ask to speak to one of the nurses for clinical concerns.  If you have a medical emergency, go to the nearest emergency room or call 911.  A surgeon from [facility] is always on call at the hospital ° ° °[address], [address], [place], [place]  [address] ? ° [address] [address], [place], [place]   [address] °[phone] ? [phone] ? FAX [phone] °Web site:  [email or web address] ° °What to eat: ° °For your first meals, you should eat lightly; only small meals initially.  If you do not have nausea, you may eat larger meals.  Avoid spicy, greasy and heavy food.   ° °General Anesthesia, Adult, Care After  °Refer to this sheet in the next few weeks. These instructions provide you with information on caring for yourself after your procedure. Your health care provider may also give you more specific instructions. Your treatment has been planned according to current medical practices, but problems sometimes occur. Call your health care provider if you have any problems or questions after your procedure.  °WHAT TO EXPECT AFTER THE PROCEDURE  °After the procedure, it is typical to experience:  °Sleepiness.  °Nausea and vomiting. °HOME CARE INSTRUCTIONS  °For the first 24 hours after general anesthesia:  °Have a responsible person with you.  °Do not drive a car. If you are alone, do not take public transportation.  °Do not drink alcohol.  °Do not take medicine that has not been prescribed by your health care provider.  °Do not sign important papers or make important decisions.  °You may resume a normal diet and activities as directed by your health care provider.  °Change bandages (dressings) as directed.  °If you have questions or problems that seem related to general anesthesia, call the hospital and ask for the anesthetist or anesthesiologist on call. °SEEK MEDICAL CARE IF:  °You have nausea and vomiting that continue the day after anesthesia.  °You develop a rash. °SEEK IMMEDIATE MEDICAL CARE IF:  °You have difficulty breathing.  °You have chest pain.  °You have any allergic problems. °Document Released: 01/07/2001 Document Revised: 06/03/2013 Document Reviewed: 04/16/2013  °ExitCare® Patient Information ©2014 [employer].  ° ° °

## 2014-03-31 ENCOUNTER — Encounter (HOSPITAL_COMMUNITY): Payer: Self-pay | Admitting: General Surgery

## 2014-04-07 ENCOUNTER — Telehealth (INDEPENDENT_AMBULATORY_CARE_PROVIDER_SITE_OTHER): Payer: Self-pay

## 2014-04-07 DIAGNOSIS — G8918 Other acute postprocedural pain: Secondary | ICD-10-CM

## 2014-04-07 MED ORDER — OXYCODONE-ACETAMINOPHEN 5-325 MG PO TABS: 1.0000 | ORAL_TABLET | ORAL | Status: DC | PRN

## 2014-04-07 NOTE — Telephone Encounter (Signed)
Pt called in asking for refill of percocet. Pt 9 days s/p umbilical hernia repair. Advised we will send request to our urgent office doctor this afternoon since Dr Derrell Lollingamirez isn't available today. We will call him back this afternoon.

## 2014-04-07 NOTE — Telephone Encounter (Signed)
Pt aware script up front for pick up.

## 2014-04-07 NOTE — Addendum Note (Signed)
Addended by: Brennan BaileyBROOKS, MICHELLE on: 04/07/2014 05:03 PM   Modules accepted: Orders

## 2014-04-14 ENCOUNTER — Encounter (INDEPENDENT_AMBULATORY_CARE_PROVIDER_SITE_OTHER): Payer: Self-pay | Admitting: General Surgery

## 2014-04-14 ENCOUNTER — Ambulatory Visit (INDEPENDENT_AMBULATORY_CARE_PROVIDER_SITE_OTHER): Payer: 59 | Admitting: General Surgery

## 2014-04-14 VITALS — BP 122/82 | HR 80 | Temp 97.5°F | Ht 74.0 in | Wt 187.0 lb

## 2014-04-14 DIAGNOSIS — Z9889 Other specified postprocedural states: Secondary | ICD-10-CM

## 2014-04-14 NOTE — Progress Notes (Signed)
Patient ID: Garrett Jimenez, male   DOB: [birth year], 33 y.o.   MRN: [id number] Post op course Patient is a 33 year old male status post laparoscopicumbilical hernia repair with mesh. The patient has been doing well postoperatively. His only having minimal pain at this time. On Exam:  Wounds are clean dry and intact.  there is no hernia on palpation  Assessment and Plan A 33 year old male status post laparoscopic umbilical hernia repair with mesh 1. We discussed weightlifting restrictions 2. Patient follow up as needed  Garrett FillerArmando Kc Sedlak, MD [facility] General & Minimally Invasive Surgery Trauma & Emergency Surgery

## 2014-04-23 ENCOUNTER — Encounter (INDEPENDENT_AMBULATORY_CARE_PROVIDER_SITE_OTHER): Payer: Self-pay | Admitting: General Surgery

## 2014-04-23 ENCOUNTER — Ambulatory Visit (INDEPENDENT_AMBULATORY_CARE_PROVIDER_SITE_OTHER): Payer: 59 | Admitting: General Surgery

## 2014-04-23 VITALS — BP 122/76 | HR 71 | Temp 98.0°F | Wt 187.0 lb

## 2014-04-23 DIAGNOSIS — Z9889 Other specified postprocedural states: Secondary | ICD-10-CM

## 2014-04-23 NOTE — Progress Notes (Signed)
Patient ID: Garrett Jimenez, male   DOB: November 27, 1980, 33 y.o.   MRN: [id number] Post op course W. Status post left open umbilical hernia repair. Patient was doing well postoperatively. He states her last week and had an upper respiratory illness with some severe coughing. He felt some tenderness and tingling of his umbilicus. He came in today for followup secondary to this.  On Exam: There is no hernial palpation, he does have an area of induration at the umbilical suture site.  Assessment and Plan 33 year old male status post left umbilical hernia repair with mesh 1. I do not believe there is any recurrence of the hernia. The pain that was likely felt was likely due to muscle contraction 2. The patient follow up as needed   Garrett FillerArmando Dnaiel Voller, MD [facility] General [facility]imally Invasive Surgery Trauma & Emergency Surgery

## 2015-11-28 ENCOUNTER — Encounter: Payer: Self-pay | Admitting: Neurology

## 2015-11-28 ENCOUNTER — Ambulatory Visit (INDEPENDENT_AMBULATORY_CARE_PROVIDER_SITE_OTHER): Payer: 59 | Admitting: Neurology

## 2015-11-28 VITALS — BP 122/76 | HR 68 | Ht 74.0 in | Wt 177.0 lb

## 2015-11-28 DIAGNOSIS — R5383 Other fatigue: Secondary | ICD-10-CM | POA: Insufficient documentation

## 2015-11-28 DIAGNOSIS — R202 Paresthesia of skin: Secondary | ICD-10-CM

## 2015-11-28 NOTE — Progress Notes (Signed)
PATIENT: Garrett Jimenez DOB: 1981/01/30  Chief Complaint  Patient presents with  . Numbness    Reports constant sensations of numbness and tingling in his four extremities that vary in intensity.  His other concerns are his decreased libido, weight loss, fatigue, and visual disturbances.     HISTORICAL  Garrett Jimenez is a 35 years old right-handed male, seen in refer by his primary care physician Dr. Salli Real in November 28 2015 for evaluation of constellation of complaints,  He had past medical history of depression, anxiety, has been using daily marijuana 8-10 times each day for few months, he complains of few months history of weight loss, also reported irregular eating habits, sometimes he can go 20 hours without eating anything, when he started to eat, he had been eating without feeling full, he also complains of frequent urination, difficulty thinking, concentration, spasm around his eyes, transient distortion in his peripheral visual field, intermittent for revision, alternating skin sensation, sometimes warm, followed by coldness, difficulty sleeping, racing thoughts.  He also complains of difficulty putting his thoughts together, word finding difficulties, he was put on leave of absence since February 2017 because of constellation of his symptoms, he has looked it up on the Internet, concern of possibility of MS, desires complete evaluations.    I reviewed laboratory evaluation, mild elevated LDL 147, cholesterol 210, mildly low vitamin D 20, normal CMP, CBC, A1c 4.7, normal TSH  REVIEW OF SYSTEMS: Full 14 system review of systems performed and notable only for weight gain, fatigue, loss of vision, urination problem, achy muscles, skin sensitivity, memory loss, confusion, numbness, weakness, slurred speech, dizziness, depression, anxiety, not enough sleep, change in appetite, disinterested in activities, racing thoughts.   ALLERGIES: Allergies  Allergen Reactions  . Amoxicillin Hives  and Rash    HOME MEDICATIONS: No current outpatient prescriptions on file.   No current facility-administered medications for this visit.    PAST MEDICAL HISTORY: Past Medical History  Diagnosis Date  . Pneumonia     pleuritis  . Umbilical hernia   . Hyperlipemia   . Vitamin D deficiency     PAST SURGICAL HISTORY: Past Surgical History  Procedure Laterality Date  . Wisdome teeth    . Umbilical hernia repair N/A 03/29/2014    Procedure: LAPAROSCOPIC UMBILICAL HERNIA;  Surgeon: Axel Filler, MD;  Location: [facility] OR;  Service: General;  Laterality: N/A;  . Insertion of mesh N/A 03/29/2014    Procedure: INSERTION OF MESH;  Surgeon: Axel Filler, MD;  Location: [facility] OR;  Service: General;  Laterality: N/A;    FAMILY HISTORY: Family History  Problem Relation Age of Onset  . Hypertension Father   . Breast cancer Mother     SOCIAL HISTORY:  Social History   Social History  . Marital Status: Married    Spouse Name: N/A  . Number of Children: 2  . Years of Education: HS   Occupational History  . Manager     [employer]   Social History Main Topics  . Smoking status: Current Every Day Smoker -- 0.50 packs/day    Types: Cigarettes  . Smokeless tobacco: Never Used  . Alcohol Use: 3.0 oz/week    5 Cans of beer per week     Comment: 1-2 beers per day  . Drug Use: Yes     Comment: Smokes 1 gram of marijuana per day.  Marland Kitchen Sexual Activity: Not on file   Other Topics Concern  . Not  on file   Social History Narrative   Lives at home with wife and two children.   Right-handed.   2-3 cups coffee per day.     PHYSICAL EXAM   Filed Vitals:   11/28/15 0734  BP: 122/76  Pulse: 68  Height:  (1.88 m)  Weight: 177 lb (80.287 kg)    Not recorded      Body mass index is 22.72 kg/(m^2).  PHYSICAL EXAMNIATION:  Gen: NAD, conversant, well nourised, obese, well groomed                     Cardiovascular: Regular rate rhythm, no  peripheral edema, warm, nontender. Eyes: Conjunctivae clear without exudates or hemorrhage Neck: Supple, no carotid bruise. Pulmonary: Clear to auscultation bilaterally   NEUROLOGICAL EXAM:  MENTAL STATUS: Anxious looking middle-age male  Speech:    Speech is normal; fluent and spontaneous with normal comprehension.  Cognition:     Orientation to time, place and person     Normal recent and remote memory     Normal Attention span and concentration     Normal Language, naming, repeating,spontaneous speech     Fund of knowledge   CRANIAL NERVES: CN II: Visual fields are full to confrontation. Fundoscopic exam is normal with sharp discs and no vascular changes. Pupils are round equal and briskly reactive to light. CN III, IV, VI: extraocular movement are normal. No ptosis. CN V: Facial sensation is intact to pinprick in all 3 divisions bilaterally. Corneal responses are intact.  CN VII: Face is symmetric with normal eye closure and smile. CN VIII: Hearing is normal to rubbing fingers CN IX, X: Palate elevates symmetrically. Phonation is normal. CN XI: Head turning and shoulder shrug are intact CN XII: Tongue is midline with normal movements and no atrophy.  MOTOR: There is no pronator drift of out-stretched arms. Muscle bulk and tone are normal. Muscle strength is normal.  REFLEXES: Reflexes are 2+ and symmetric at the biceps, triceps, knees, and ankles. Plantar responses are flexor.  SENSORY: Intact to light touch, pinprick, position sense, and vibration sense are intact in fingers and toes.  COORDINATION: Rapid alternating movements and fine finger movements are intact. There is no dysmetria on finger-to-nose and heel-knee-shin.    GAIT/STANCE: Posture is normal. Gait is steady with normal steps, base, arm swing, and turning. Heel and toe walking are normal. Tandem gait is normal.  Romberg is absent.   DIAGNOSTIC DATA (LABS, IMAGING, TESTING) - I reviewed patient records,  labs, notes, testing and imaging myself where available.   ASSESSMENT AND PLAN  Garrett Jimenez is a 35 y.o. male    It is difficult to localize the lesion with his constellation of complaints, this includes difficulty concentrating, intermittent paresthesia, visual change,  I have explained to the patient, above findings most likely related to his mood disorder, and frequent multiple daily dose of marijuana use,  He desires further evaluation, I will proceed with MRI of the brain, cervical spine, I will call him result, if there are any significant abnormalities, we will proceed with further evaluation,  I have encouraged him continue follow-up with primary care, consider psychiatric referral for his depression anxiety   Levert Feinstein, M.D. Ph.D.  [facility] [address] [place], [place] [address] Ph: [phone] Fax: [phone]  CC: Referring Provider

## 2015-12-14 ENCOUNTER — Ambulatory Visit (INDEPENDENT_AMBULATORY_CARE_PROVIDER_SITE_OTHER): Payer: 59

## 2015-12-14 DIAGNOSIS — R202 Paresthesia of skin: Secondary | ICD-10-CM

## 2017-08-08 ENCOUNTER — Ambulatory Visit (INDEPENDENT_AMBULATORY_CARE_PROVIDER_SITE_OTHER): Payer: BLUE CROSS/BLUE SHIELD

## 2017-08-08 ENCOUNTER — Encounter (HOSPITAL_COMMUNITY): Payer: Self-pay | Admitting: Family Medicine

## 2017-08-08 ENCOUNTER — Ambulatory Visit (HOSPITAL_COMMUNITY)
Admission: EM | Admit: 2017-08-08 | Discharge: 2017-08-08 | Disposition: A | Discharge status: Home / Self Care | Payer: BLUE CROSS/BLUE SHIELD | Attending: Family Medicine | Admitting: Family Medicine

## 2017-08-08 DIAGNOSIS — M25512 Pain in left shoulder: Secondary | ICD-10-CM

## 2017-08-08 DIAGNOSIS — R202 Paresthesia of skin: Secondary | ICD-10-CM

## 2017-08-08 DIAGNOSIS — W19XXXA Unspecified fall, initial encounter: Secondary | ICD-10-CM

## 2017-08-08 DIAGNOSIS — M79609 Pain in unspecified limb: Secondary | ICD-10-CM

## 2017-08-08 DIAGNOSIS — M79602 Pain in left arm: Secondary | ICD-10-CM | POA: Diagnosis not present

## 2017-08-08 NOTE — ED Triage Notes (Addendum)
Fell from a ladder today.  Patient reports hitting head and feeling dazed.  Unable to lift left arm.  Pain in left shoulder blade, elbow burning sensation.  Onset 1 pm today  Patient says ladder slid down side of house and landed on a deck box, throwing him backwards.  ?6-7 foot fall

## 2017-08-13 NOTE — ED Provider Notes (Signed)
[facility]   [id number] 08/08/17 Arrival Time: 1535  ASSESSMENT & PLAN:  1. Fall, initial encounter   2. Acute pain of left shoulder   3. Paresthesia and pain of left extremity    No dislocation seen on x-rays today. Recommend orthopaedic evaluation. May f/u here as needed.  Reviewed expectations re: course of current medical issues. Questions answered. Outlined signs and symptoms indicating need for more acute intervention. Patient verbalized understanding. After Visit Summary given.   SUBJECTIVE:  Garrett Jimenez is a 36 y.o. male who presents with complaint of L shoulder discomfort after fall a few days ago. Questions dislocation. "Hurts to move it." No extremity sensation changes or weakness. H/O similar discomfort to L shoulder in the past. Ibuprofen OTC with temporary relief. No swelling/bruising.  ROS: As per HPI.   OBJECTIVE:  Vitals:   08/08/17 1555  BP: 113/75  Pulse: 73  Resp: 18  Temp: 97.9 F (36.6 C)  TempSrc: Oral  SpO2: 98%    General appearance: alert; no distress Eyes: PERRLA; EOMI; conjunctiva normal HENT: normocephalic; atraumatic; TMs normal; nasal mucosa normal; oral mucosa normal Neck: supple Lungs: clear to auscultation bilaterally Heart: regular rate and rhythm Back: no CVA tenderness Extremities: no cyanosis or edema; symmetrical with no gross deformities; has trouble extending LUE above head, reaches approx 90 degrees with discomfort; no swelling or bruising; overall difficult shoulder exam secondary to his discomfort/stiffness Skin: warm and dry Neurologic: normal gait; normal symmetric reflexes Psychological: alert and cooperative; normal mood and affect  Imaging: Dg Shoulder Left  Result Date: 08/08/2017 CLINICAL DATA:  Left shoulder injury due to a fall while cleaning gutters today. Initial encounter. EXAM: LEFT SHOULDER - 2+ VIEW COMPARISON:  None. FINDINGS: There is no evidence of fracture or dislocation. There is no  evidence of arthropathy or other focal bone abnormality. Soft tissues are unremarkable. IMPRESSION: Negative exam. Electronically Signed   By: Drusilla Kanner M.D.   On: 08/08/2017 16:27    Allergies  Allergen Reactions  . Amoxicillin Hives and Rash    Past Medical History:  Diagnosis Date  . Hyperlipemia   . Pneumonia    pleuritis  . Umbilical hernia   . Vitamin D deficiency    Social History   Social History  . Marital status: Married    Spouse name: N/A  . Number of children: 2  . Years of education: HS   Occupational History  . Manager     [employer]   Social History Main Topics  . Smoking status: Current Every Day Smoker    Packs/day: 0.50    Types: Cigarettes  . Smokeless tobacco: Never Used  . Alcohol use 3.0 oz/week    5 Cans of beer per week     Comment: 1-2 beers per day  . Drug use: Yes     Comment: Smokes 1 gram of marijuana per day.  Marland Kitchen Sexual activity: Not on file   Other Topics Concern  . Not on file   Social History Narrative   Lives at home with wife and two children.   Right-handed.   2-3 cups coffee per day.   Family History  Problem Relation Age of Onset  . Breast cancer Mother   . Hypertension Father    Past Surgical History:  Procedure Laterality Date  . INSERTION OF MESH N/A 03/29/2014   Procedure: INSERTION OF MESH;  Surgeon: Axel Filler, MD;  Location: [facility] OR;  Service: General;  Laterality: N/A;  .  UMBILICAL HERNIA REPAIR N/A 03/29/2014   Procedure: LAPAROSCOPIC UMBILICAL HERNIA;  Surgeon: Axel FillerArmando Ramirez, MD;  [facility];  Service: General;  Laterality: N/A;  . wisdome teeth       [name], [name], MD 08/13/17 1022

## 2021-11-20 ENCOUNTER — Other Ambulatory Visit: Payer: Self-pay | Admitting: Physician Assistant

## 2021-11-20 DIAGNOSIS — G8929 Other chronic pain: Secondary | ICD-10-CM

## 2021-12-08 ENCOUNTER — Ambulatory Visit
Admission: RE | Admit: 2021-12-08 | Discharge: 2021-12-08 | Disposition: A | Discharge status: Home / Self Care | Payer: BLUE CROSS/BLUE SHIELD | Source: Ambulatory Visit | Attending: Physician Assistant | Admitting: Physician Assistant

## 2021-12-08 DIAGNOSIS — G8929 Other chronic pain: Secondary | ICD-10-CM

## 2022-01-10 ENCOUNTER — Encounter: Payer: Self-pay | Admitting: Neurology

## 2022-01-18 ENCOUNTER — Ambulatory Visit (INDEPENDENT_AMBULATORY_CARE_PROVIDER_SITE_OTHER): Payer: 59 | Admitting: Neurology

## 2022-01-18 DIAGNOSIS — R531 Weakness: Secondary | ICD-10-CM

## 2022-01-18 NOTE — Progress Notes (Signed)
Patient arrived for EMG, however, opted not to proceed with testing.   ?

## 2022-03-10 ENCOUNTER — Emergency Department (HOSPITAL_COMMUNITY): Payer: 59

## 2022-03-10 ENCOUNTER — Other Ambulatory Visit: Payer: Self-pay

## 2022-03-10 ENCOUNTER — Emergency Department (HOSPITAL_COMMUNITY)
Admission: EM | Admit: 2022-03-10 | Discharge: 2022-03-10 | Disposition: A | Discharge status: Home / Self Care | Payer: 59 | Attending: Emergency Medicine | Admitting: Emergency Medicine

## 2022-03-10 ENCOUNTER — Encounter (HOSPITAL_COMMUNITY): Payer: Self-pay | Admitting: Emergency Medicine

## 2022-03-10 DIAGNOSIS — R072 Precordial pain: Secondary | ICD-10-CM | POA: Diagnosis present

## 2022-03-10 DIAGNOSIS — M79604 Pain in right leg: Secondary | ICD-10-CM | POA: Insufficient documentation

## 2022-03-10 LAB — CBC
HCT: 51.6 % (ref 39.0–52.0)
Hemoglobin: 17.5 g/dL — ABNORMAL HIGH (ref 13.0–17.0)
MCH: 31.4 pg (ref 26.0–34.0)
MCHC: 33.9 g/dL (ref 30.0–36.0)
MCV: 92.5 fL (ref 80.0–100.0)
Platelets: 197 10*3/uL (ref 150–400)
RBC: 5.58 MIL/uL (ref 4.22–5.81)
RDW: 11.5 % (ref 11.5–15.5)
WBC: 5.9 10*3/uL (ref 4.0–10.5)
nRBC: 0 % (ref 0.0–0.2)

## 2022-03-10 LAB — BASIC METABOLIC PANEL
Anion gap: 10 (ref 5–15)
BUN: 20 mg/dL (ref 6–20)
CO2: 27 mmol/L (ref 22–32)
Calcium: 10 mg/dL (ref 8.9–10.3)
Chloride: 100 mmol/L (ref 98–111)
Creatinine, Ser: 0.85 mg/dL (ref 0.61–1.24)
GFR, Estimated: >60 mL/min (ref >60)
Glucose, Bld: 99 mg/dL (ref 70–99)
Potassium: 4.3 mmol/L (ref 3.5–5.1)
Sodium: 137 mmol/L (ref 135–145)

## 2022-03-10 LAB — D-DIMER, QUANTITATIVE: D-Dimer, Quant: <0.27 ug/mL-FEU (ref 0.00–0.50)

## 2022-03-10 LAB — TROPONIN I (HIGH SENSITIVITY)
Troponin I (High Sensitivity): 2 ng/L (ref <18)
Troponin I (High Sensitivity): <2 ng/L (ref <18)

## 2022-03-10 MED ORDER — FAMOTIDINE 20 MG PO TABS: 20.0000 mg | ORAL_TABLET | Freq: Every day | ORAL | 0 refills | Status: AC

## 2022-03-10 MED ORDER — ALUM & MAG HYDROXIDE-SIMETH 200-200-20 MG/5ML PO SUSP
30.0000 mL | Freq: Once | ORAL | Status: AC
  Administered 2022-03-10: 30 mL via ORAL
  Filled 2022-03-10: qty 30

## 2022-03-10 MED ORDER — LIDOCAINE VISCOUS HCL 2 % MT SOLN
15.0000 mL | Freq: Once | OROMUCOSAL | Status: AC
Start: 2022-03-10 — End: 2022-03-10
  Administered 2022-03-10: 15 mL via ORAL
  Filled 2022-03-10: qty 15

## 2022-03-10 NOTE — ED Notes (Signed)
RN reviewed discharge instructions with pt. Pt verbalized understanding and had no further questions. VSS upon discharge.  

## 2022-03-10 NOTE — ED Provider Notes (Signed)
[facility] EMERGENCY DEPARTMENT Provider Note  CSN: [id number] Arrival date & time: 03/10/22 0054  Chief Complaint(s) Chest Pain  HPI Garrett Jimenez is a 41 y.o. male     Chest Pain Pain location:  Substernal area Pain quality: aching   Pain radiates to:  Does not radiate Pain severity:  Moderate Timing:  Constant Progression:  Waxing and waning Chronicity:  New Relieved by:  Nothing Worsened by:  Nothing  Patient also reports that prior to this he had right-sided leg pain.  Believes his right leg and right upper extremity are more swollen than the left.  He has more pronounced veins in his right leg with varicose veins as opposed to the left.  He denies any prior history of DVT/PEs, known autoimmune disorder, history of cancer, prolonged immobilization, hemoptysis.  Past Medical History Past Medical History:  Diagnosis Date   Hyperlipemia    Pneumonia    pleuritis   Umbilical hernia    Vitamin D deficiency    Patient Active Problem List   Diagnosis Date Noted   Paresthesia 11/28/2015   Fatigue 11/28/2015   Home Medication(s) Prior to Admission medications   Medication Sig Start Date End Date Taking? Authorizing Provider  famotidine (PEPCID) 20 MG tablet Take 1 tablet (20 mg total) by mouth daily. 03/10/22 04/09/22 Yes Naman Spychalski, Amadeo Garnet, MD                                                                                                                                    Allergies Amoxicillin  Review of Systems Review of Systems  Cardiovascular:  Positive for chest pain.  As noted in HPI  Physical Exam Vital Signs  I have reviewed the triage vital signs BP 111/85   Pulse 66   Temp 99 F (37.2 C)   Resp 15   SpO2 99%   Physical Exam Vitals reviewed.  Constitutional:      General: He is not in acute distress.    Appearance: He is well-developed. He is not diaphoretic.  HENT:     Head: Normocephalic and atraumatic.     Nose: Nose  normal.  Eyes:     General: No scleral icterus.       Right eye: No discharge.        Left eye: No discharge.     Conjunctiva/sclera: Conjunctivae normal.     Pupils: Pupils are equal, round, and reactive to light.  Cardiovascular:     Rate and Rhythm: Normal rate and regular rhythm.     Heart sounds: No murmur heard.   No friction rub. No gallop.  Pulmonary:     Effort: Pulmonary effort is normal. No respiratory distress.     Breath sounds: Normal breath sounds. No stridor. No rales.  Abdominal:     General: There is no distension.     Palpations: Abdomen is soft.     Tenderness: There  is no abdominal tenderness.  Musculoskeletal:        General: No tenderness.     Cervical back: Normal range of motion and neck supple.     Right lower leg: No swelling or tenderness. No edema.     Left lower leg: No swelling or tenderness. No edema.     Comments: Varicose veins on RLE  Skin:    General: Skin is warm and dry.     Findings: No erythema or rash.  Neurological:     Mental Status: He is alert and oriented to person, place, and time.    ED Results and Treatments Labs (all labs ordered are listed, but only abnormal results are displayed) Labs Reviewed  CBC - Abnormal; Notable for the following components:      Result Value   Hemoglobin 17.5 (*)    All other components within normal limits  BASIC METABOLIC PANEL  D-DIMER, QUANTITATIVE  TROPONIN I (HIGH SENSITIVITY)  TROPONIN I (HIGH SENSITIVITY)                                                                                                                         EKG  EKG Interpretation  Date/Time:  Saturday Mar 10 2022 04:57:26 EDT Ventricular Rate:  57 PR Interval:  143 QRS Duration: 94 QT Interval:  437 QTC Calculation: 426 R Axis:   81 Text Interpretation: Sinus rhythm j-point elevation No old tracing to compare Reconfirmed by Drema Pryardama, Naseer Hearn [phone]([id number]) on 03/10/2022 6:39:17 AM       Radiology DG Chest 2  View  Result Date: 03/10/2022 CLINICAL DATA:  Chest pain EXAM: CHEST - 2 VIEW COMPARISON:  None Available. FINDINGS: Cardiac and mediastinal contours are within normal limits. No focal pulmonary opacity. No pleural effusion or pneumothorax. No acute osseous abnormality. IMPRESSION: No acute cardiopulmonary process. Electronically Signed   By: Wiliam KeAlison  Vasan M.D.   On: 03/10/2022 02:12    Pertinent labs & imaging results that were available during my care of the patient were reviewed by me and considered in my medical decision making (see MDM for details).  Medications Ordered in ED Medications  alum & mag hydroxide-simeth (MAALOX/MYLANTA) 200-200-20 MG/5ML suspension 30 mL (30 mLs Oral Given 03/10/22 0457)    And  lidocaine (XYLOCAINE) 2 % viscous mouth solution 15 mL (15 mLs Oral Given 03/10/22 0457)  Procedures Procedures  (including critical care time)  Medical Decision Making / ED Course    Complexity of Problem:  Patient's presenting problem/concern, DDX, and MDM listed below: Substernal chest pain Atypical for ACS.  Heart score less than 3. Low pretest probability for pulmonary embolism.  Will obtain a dimer. Presentation not classic for arctic dissection or esophageal perforation We will assess for pneumonia, pneumothorax. Also considering pericarditis, GERD  Reported right-sided swelling with right lower leg pain No obvious swelling or edema noted on exam Patient does have varicosities to the right lower extremity We will obtain a dimer to rule out DVT.  Hospitalization Considered:  Yes if positive for large PE  Initial Intervention:  GI cocktail    Complexity of Data:   Cardiac Monitoring: EKG without acute ischemic changes. J-point elevation noted.  Laboratory Tests ordered listed below with my independent interpretation: CBC without  leukocytosis or anemia No significant electrolyte derangements or renal sufficiency Serial troponins negative x2 Dimer negative   Imaging Studies ordered listed below with my independent interpretation: On my read of the chest x-ray, there was no evidence suggestive of pneumonia, pneumothorax, pneumomediastinum, pulmonary edema concerning for new or exacerbation of heart failure, abnormal contour of the mediastinum to suggest dissection, and no evidence of acute injuries.      ED Course:    Assessment, Add'l Intervention, and Reassessment: Precordial chest pain  Ruled out for ACS. Dimer negative -doubt PE or DVT Pain improved after GI cocktail.  Likely the etiology.  Final Clinical Impression(s) / ED Diagnoses Final diagnoses:  Precordial pain   The patient appears reasonably screened and/or stabilized for discharge and I doubt any other medical condition or other [facility] requiring further screening, evaluation, or treatment in the ED at this time prior to discharge. Safe for discharge with strict return precautions.  Disposition: Discharge  Condition: Good  I have discussed the results, Dx and Tx plan with the patient/family who expressed understanding and agree(s) with the plan. Discharge instructions discussed at length. The patient/family was given strict return precautions who verbalized understanding of the instructions. No further questions at time of discharge.    ED Discharge Orders          Ordered    famotidine (PEPCID) 20 MG tablet  Daily        03/10/22 0637            Follow Up: Alyson Ingles, PA-C [address] [place] [address] [phone]  Call  to schedule an appointment for close follow up           This chart was dictated using voice recognition software.  Despite best efforts to proofread,  errors can occur which can change the documentation meaning.    Nira Conn, MD 03/10/22 [phone]

## 2022-03-10 NOTE — ED Triage Notes (Signed)
Pt reported to ED with c/o right sided chest pain, shortness of breath, increased weakness and swelling to entire right side of body, however this RN was unable to detect any swelling. Pt states that veins are more pronounced in right leg and right hand than those on the left hand side. Pt also noted to appear as anxious, stands up several times while speaking with this RN. Pt states he believes he has DVTs, states that swelling is very severe to right knee and he feels as if it is about to explode. No acute distress noted at time of triage.

## 2022-05-31 ENCOUNTER — Encounter: Payer: 59 | Admitting: Neurology

## 2023-05-17 ENCOUNTER — Ambulatory Visit (HOSPITAL_COMMUNITY)
Admission: EM | Admit: 2023-05-17 | Discharge: 2023-05-17 | Disposition: A | Discharge status: Home / Self Care | Payer: 59

## 2023-05-17 DIAGNOSIS — F4320 Adjustment disorder, unspecified: Secondary | ICD-10-CM

## 2023-05-17 NOTE — ED Notes (Signed)
Patient A&O x 4, ambulatory. Patient discharged in no acute distress. Patient denied SI/HI, A/VH upon discharge. Patient verbalized understanding of all discharge instructions explained by staff, to include follow up appointments, and safety plan. Patient reported mood 10/10.  Pt belongings returned to patient from locker intact. Patient escorted to lobby via staff for transport to destination. Safety maintained.  

## 2023-05-17 NOTE — Discharge Instructions (Addendum)
Discharge recommendations:   Outpatient Follow up: Please review list of outpatient resources for psychiatry and counseling. Please follow up with your primary care provider for all medical related needs.   Therapy: We recommend that patient participate in individual therapy to address mental health concerns.  Safety:   The following safety precautions should be taken:   No sharp objects. This includes scissors, razors, scrapers, and putty knives.   Chemicals should be removed and locked up.   Medications should be removed and locked up.   Weapons should be removed and locked up. This includes firearms, knives and instruments that can be used to cause injury.   The patient should abstain from use of illicit substances/drugs and abuse of any medications.  If symptoms worsen or do not continue to improve or if the patient becomes actively suicidal or homicidal then it is recommended that the patient return to the closest hospital emergency department, the [facility], or call 911 for further evaluation and treatment. National Suicide Prevention Lifeline 1-800-SUICIDE or 518-221-5517.  About 988 988 offers 24/7 access to trained crisis counselors who can help people experiencing mental health-related distress. People can call or text 988 or chat 988lifeline.org for themselves or if they are worried about a loved one who may need crisis support.   Please contact one of the following facilities to start medication management and therapy services:   [facility] at [address]  [place], [place] [address] [phone]   [place]  [address] [place], [place] [address] [phone]  [place]  [address] [place], [place] [address] [phone]  [facility]  [address]  [place], [place] [address] [phone]  [facility]  [place], [place] [address] [phone]  [place]  [address],  [place], [place] [address] [phone]
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
## 2024-10-06 ENCOUNTER — Ambulatory Visit
Admission: EM | Admit: 2024-10-06 | Discharge: 2024-10-06 | Disposition: A | Discharge status: Home / Self Care | Attending: Family Medicine | Admitting: Family Medicine

## 2024-10-06 ENCOUNTER — Encounter: Payer: Self-pay | Admitting: Emergency Medicine

## 2024-10-06 DIAGNOSIS — S61412A Laceration without foreign body of left hand, initial encounter: Secondary | ICD-10-CM | POA: Diagnosis not present

## 2024-10-06 NOTE — Discharge Instructions (Signed)
 Keep area clean May wash briefly but do not soak Stitches out in 7 days Call for problems

## 2024-10-06 NOTE — ED Provider Notes (Signed)
 " [facility]    CSN: [id number] Arrival date & time: 10/06/24  1640      History   Chief Complaint Chief Complaint  Patient presents with   Laceration    HPI Garrett Jimenez is a 43 y.o. male.   HPI Laceration on left palm from washing dishes.  The glass was broken.  He is otherwise healthy. Past Medical History:  Diagnosis Date   Hyperlipemia    Pneumonia    pleuritis   Umbilical hernia    Vitamin D deficiency     Patient Active Problem List   Diagnosis Date Noted   Paresthesia 11/28/2015   Fatigue 11/28/2015    Past Surgical History:  Procedure Laterality Date   INSERTION OF MESH N/A 03/29/2014   Procedure: INSERTION OF MESH;  Surgeon: Lynda Leos, MD;  Location: [facility] OR;  Service: General;  Laterality: N/A;   UMBILICAL HERNIA REPAIR N/A 03/29/2014   Procedure: LAPAROSCOPIC UMBILICAL HERNIA;  Surgeon: Lynda Leos, MD;  Location: [facility] OR;  Service: General;  Laterality: N/A;   wisdome teeth         Home Medications    Prior to Admission medications  Medication Sig Start Date End Date Taking? Authorizing Provider  ARIPiprazole (ABILIFY) 5 MG tablet Take 5 mg by mouth every morning.   Yes [provider]  famotidine  (PEPCID ) 20 MG tablet Take 1 tablet (20 mg total) by mouth daily. 03/10/22 04/09/22  Trine Raynell Moder, MD    Family History Family History  Problem Relation Age of Onset   Breast cancer Mother    Hypertension Father     Social History Social History[1]   Allergies   Amoxicillin   Review of Systems Review of Systems  See HPI Physical Exam Triage Vital Signs ED Triage Vitals  Encounter Vitals Group     BP 10/06/24 1657 104/67     Girls Systolic BP Percentile --      Girls Diastolic BP Percentile --      Boys Systolic BP Percentile --      Boys Diastolic BP Percentile --      Pulse Rate 10/06/24 1657 77     Resp 10/06/24 1657 18     Temp 10/06/24 1657 97.7 F (36.5 C)     Temp Source 10/06/24 1657  Oral     SpO2 10/06/24 1657 96 %     Weight 10/06/24 1656 170 lb (77.1 kg)     Height 10/06/24 1656 6' 2 (1.88 m)     Head Circumference --      Peak Flow --      Pain Score 10/06/24 1656 0     Pain Loc --      Pain Education --      Exclude from Growth Chart --    No data found.  Updated Vital Signs BP 104/67 (BP Location: Right Arm)   Pulse 77   Temp 97.7 F (36.5 C) (Oral)   Resp 18   Ht 6' 2 (1.88 m)   Wt 77.1 kg   SpO2 96%   BMI 21.83 kg/m       Physical Exam Constitutional:      General: He is not in acute distress.    Appearance: He is well-developed.  HENT:     Head: Normocephalic and atraumatic.  Eyes:     Conjunctiva/sclera: Conjunctivae normal.     Pupils: Pupils are equal, round, and reactive to light.  Cardiovascular:     Rate and  Rhythm: Normal rate.  Pulmonary:     Effort: Pulmonary effort is normal. No respiratory distress.  Musculoskeletal:        General: Normal range of motion.     Cervical back: Normal range of motion.  Skin:    General: Skin is warm and dry.     Findings: Lesion present.     Comments: 1 inch laceration of the left palm at the webspace between the index and thumb  Neurological:     Mental Status: He is alert.      UC Treatments / Results  Labs (all labs ordered are listed, but only abnormal results are displayed) Labs Reviewed - No data to display  EKG   Radiology No results found.  Procedures Laceration Repair  Date/Time: 10/06/2024 5:29 PM  Performed by: Maranda Jamee Jacob, MD Authorized by: Maranda Jamee Jacob, MD   Consent:    Consent obtained:  Verbal   Consent given by:  Patient Universal protocol:    Patient identity confirmed:  Verbally with patient Anesthesia:    Anesthesia method:  Local infiltration   Local anesthetic:  Lidocaine  1% w/o epi Laceration details:    Location:  Hand   Hand location:  L palm   Length (cm):  2.5   Depth (mm):  10 Pre-procedure details:    Preparation:   Patient was prepped and draped in usual sterile fashion Exploration:    Wound exploration: entire depth of wound visualized   Treatment:    Area cleansed with:  Shur-Clens   Amount of cleaning:  Standard   Debridement:  None   Undermining:  None   Scar revision: no   Skin repair:    Repair method:  Sutures   Suture size:  4-0   Suture technique:  Simple interrupted   Number of sutures:  3 Approximation:    Approximation:  Close Repair type:    Repair type:  Simple Post-procedure details:    Dressing:  Antibiotic ointment and non-adherent dressing   Procedure completion:  Tolerated well, no immediate complications  (including critical care time)  Medications Ordered in UC Medications - No data to display  Initial Impression / Assessment and Plan / UC Course  I have reviewed the triage vital signs and the nursing notes.  Pertinent labs & imaging results that were available during my care of the patient were reviewed by me and considered in my medical decision making (see chart for details).     Wound care discussed Final Clinical Impressions(s) / UC Diagnoses   Final diagnoses:  Laceration of left hand without foreign body, initial encounter     Discharge Instructions      Keep area clean May wash briefly but do not soak Stitches out in 7 days Call for problems   ED Prescriptions   None    PDMP not reviewed this encounter.    [1]  Social History Tobacco Use   Smoking status: Former    Current packs/day: 0.50    Types: Cigarettes   Smokeless tobacco: Never  Vaping Use   Vaping status: Never Used  Substance Use Topics   Alcohol use: Yes    Alcohol/week: 5.0 standard drinks of alcohol    Types: 5 Cans of beer per week    Comment: 1-2 beers per day   Drug use: Yes    Comment: Smokes 1 gram of marijuana per day.     Maranda Jamee Jacob, MD 10/06/24 1730  "

## 2024-10-06 NOTE — ED Triage Notes (Signed)
 Patient states that he was washing dishes and tried to catch a glass as it was falling.  He cut the inside of his left hand.  Patient unsure of last Tdap.

## 2024-10-16 ENCOUNTER — Ambulatory Visit
Admission: RE | Admit: 2024-10-16 | Discharge: 2024-10-16 | Disposition: A | Discharge status: Home / Self Care | Source: Ambulatory Visit | Attending: Emergency Medicine

## 2024-10-16 DIAGNOSIS — S61412A Laceration without foreign body of left hand, initial encounter: Secondary | ICD-10-CM

## 2024-10-16 NOTE — ED Triage Notes (Signed)
 Pt present for suture removal. Pt denies any discomfort or issues during healing
